# Patient Record
Sex: Female | Born: 1945 | Race: Asian | Hispanic: Yes | Marital: Married | Smoking: Never smoker
Health system: Southern US, Community
[De-identification: ages and names within clinical notes are randomized; demographics above are authoritative.]

## PROBLEM LIST (undated history)

## (undated) DIAGNOSIS — E119 Type 2 diabetes mellitus without complications: Secondary | ICD-10-CM

## (undated) DIAGNOSIS — G4700 Insomnia, unspecified: Secondary | ICD-10-CM

## (undated) DIAGNOSIS — K7460 Unspecified cirrhosis of liver: Secondary | ICD-10-CM

## (undated) DIAGNOSIS — D696 Thrombocytopenia, unspecified: Secondary | ICD-10-CM

## (undated) HISTORY — PX: CATARACT EXTRACTION: SUR2

## (undated) HISTORY — PX: RETINAL DETACHMENT SURGERY: SHX105

---

## 2013-12-09 ENCOUNTER — Telehealth: Payer: Self-pay | Admitting: Hematology & Oncology

## 2013-12-09 NOTE — Telephone Encounter (Signed)
Left vm w NEW PATIENT today to remind them of their appointment with Dr. Ennever. Also, advised them to bring all medication bottles and insurance card information. ° °

## 2013-12-10 ENCOUNTER — Ambulatory Visit (HOSPITAL_BASED_OUTPATIENT_CLINIC_OR_DEPARTMENT_OTHER): Payer: 59 | Admitting: Lab

## 2013-12-10 ENCOUNTER — Ambulatory Visit (HOSPITAL_BASED_OUTPATIENT_CLINIC_OR_DEPARTMENT_OTHER): Payer: 59 | Admitting: Family

## 2013-12-10 ENCOUNTER — Encounter: Payer: Self-pay | Admitting: Family

## 2013-12-10 ENCOUNTER — Ambulatory Visit: Payer: 59

## 2013-12-10 ENCOUNTER — Other Ambulatory Visit: Payer: Self-pay | Admitting: *Deleted

## 2013-12-10 VITALS — BP 150/87 | HR 69 | Temp 97.6°F | Resp 16 | Ht 60.0 in | Wt 180.0 lb

## 2013-12-10 DIAGNOSIS — D709 Neutropenia, unspecified: Secondary | ICD-10-CM

## 2013-12-10 DIAGNOSIS — D696 Thrombocytopenia, unspecified: Secondary | ICD-10-CM

## 2013-12-10 LAB — CBC WITH DIFFERENTIAL (CANCER CENTER ONLY)
BASO#: 0 10*3/uL (ref 0.0–0.2)
BASO%: 0.3 % (ref 0.0–2.0)
EOS%: 3.6 % (ref 0.0–7.0)
Eosinophils Absolute: 0.1 10*3/uL (ref 0.0–0.5)
HCT: 38 % (ref 34.8–46.6)
HGB: 13.1 g/dL (ref 11.6–15.9)
LYMPH#: 0.8 10*3/uL — ABNORMAL LOW (ref 0.9–3.3)
LYMPH%: 24.9 % (ref 14.0–48.0)
MCH: 30.5 pg (ref 26.0–34.0)
MCHC: 34.5 g/dL (ref 32.0–36.0)
MCV: 89 fL (ref 81–101)
MONO#: 0.2 10*3/uL (ref 0.1–0.9)
MONO%: 7.3 % (ref 0.0–13.0)
NEUT#: 2.1 10*3/uL (ref 1.5–6.5)
NEUT%: 63.9 % (ref 39.6–80.0)
Platelets: 49 10*3/uL — ABNORMAL LOW (ref 145–400)
RBC: 4.29 10*6/uL (ref 3.70–5.32)
RDW: 14.9 % (ref 11.1–15.7)
WBC: 3.3 10*3/uL — AB (ref 3.9–10.0)

## 2013-12-10 LAB — FERRITIN CHCC: Ferritin: 112 ng/ml (ref 9–269)

## 2013-12-10 LAB — CHCC SATELLITE - SMEAR

## 2013-12-10 NOTE — Progress Notes (Signed)
Hematology/Oncology Consultation   Name: Rita Kemp      MRN: [id number]    Location: Room/bed info not found  Date: 12/10/2013 Time:2:59 PM   REFERRING PHYSICIAN:  Dr. Theodis Sato  REASON FOR CONSULT:  Low WBC and low platelets   DIAGNOSIS:  Thrombocytopenia and neutropenia.   HISTORY OF PRESENT ILLNESS:  Ms. Rita Kemp is a very sweet 68 yo female with neutropenia and thrombocytopenia. She has had no symptoms and states that she feels good. Her recent lab work with her PCP showed a WBC count of 3.2 and platelet count of 64. Today they are 3.3 and 49. She has never had issues with her blood work before. She has no family history of blood disorders, hepatitis or splenomegaly. She thinks that she may have had malaria as a child. She has had diabetes for 15 years and she states that it is poorly controlled. She has always been at home. She baby sits a lot. She is originally from [place] and lived in [place] before moving to [place]. She had an appendectomy a few years ago and has had surgery on her left knee. She denies fatigue, fever, chills, n/v, cough, rash, headache, dizziness, SOB, chest pain, palpitations, constipation, diarrhea, blood in urine or stool. Her appetite is good. She eats a lot of fruit and vegetable but not much meat.   ROS: General ROS: negative Hematological and Lymphatic ROS: negative Endocrine ROS: negative Respiratory ROS: no cough, shortness of breath, or wheezing negative Cardiovascular ROS: no chest pain or dyspnea on exertion negative Gastrointestinal ROS: no abdominal pain, change in bowel habits, or black or bloody stools negative Genito-Urinary ROS: no dysuria, trouble voiding, or hematuria negative Musculoskeletal ROS: negative Neurological ROS: no TIA or stroke symptoms negative Dermatological ROS: negative   PAST MEDICAL HISTORY:   No past medical history on file.  ALLERGIES: Allergies  Allergen Reactions  . Amoxicillin    MEDICATIONS:   No current outpatient prescriptions on file prior to visit.   No current facility-administered medications on file prior to visit.    PAST SURGICAL HISTORY No past surgical history on file.  FAMILY HISTORY: No family history on file.  SOCIAL HISTORY:  reports that she has never smoked. She has never used smokeless tobacco. Her alcohol and drug histories are not on file.  PERFORMANCE STATUS: The patient's performance status is 0 - Asymptomatic  PHYSICAL EXAM: Most Recent Vital Signs: Blood pressure 150/87, pulse 69, temperature 97.6 F (36.4 C), temperature source Oral, resp. rate 16, height 5' (1.524 m), weight 180 lb (81.647 kg). BP 150/87  Pulse 69  Temp(Src) 97.6 F (36.4 C) (Oral)  Resp 16  Ht 5' (1.524 m)  Wt 180 lb (81.647 kg)  BMI 35.15 kg/m2  General Appearance:    Alert, cooperative, no distress, appears stated age  Head:    Normocephalic, without obvious abnormality, atraumatic           Throat:   Lips, mucosa, and tongue normal; teeth and gums normal  Neck:   Supple, symmetrical, trachea midline, no adenopathy;    thyroid:  no enlargement/tenderness/nodules; no carotid   bruit or JVD  Back:     Symmetric, no curvature, ROM normal, no CVA tenderness  Lungs:     Clear to auscultation bilaterally, respirations unlabored  Chest Wall:    No tenderness or deformity   Heart:    Regular rate and rhythm, S1 and S2 normal, no murmur, rub   or gallop  Abdomen:     Soft, non-tender, bowel sounds active all four quadrants,    no masses, no organomegaly        Extremities:   Extremities normal, atraumatic, no cyanosis or edema  Pulses:   2+ and symmetric all extremities  Skin:   Skin color, texture, turgor normal, no rashes or lesions  Lymph nodes:   Cervical, supraclavicular, and axillary nodes normal  Neurologic:   CNII-XII intact, normal strength, sensation and reflexes    throughout   LABORATORY DATA:  Results for orders placed in visit on 12/10/13 (from  the past 48 hour(s))  CBC WITH DIFFERENTIAL ([facility])     Status: Abnormal   Collection Time    12/10/13  1:27 PM      Result Value Ref Range   WBC 3.3 (*) 3.9 - 10.0 10e3/uL   RBC 4.29  3.70 - 5.32 10e6/uL   HGB 13.1  11.6 - 15.9 g/dL   HCT 38.0  34.8 - 46.6 %   MCV 89  81 - 101 fL   MCH 30.5  26.0 - 34.0 pg   MCHC 34.5  32.0 - 36.0 g/dL   RDW 14.9  11.1 - 15.7 %   Platelets 49 Platelet count consistent in citrate (*) 145 - 400 10e3/uL   NEUT# 2.1  1.5 - 6.5 10e3/uL   LYMPH# 0.8 (*) 0.9 - 3.3 10e3/uL   MONO# 0.2  0.1 - 0.9 10e3/uL   Eosinophils Absolute 0.1  0.0 - 0.5 10e3/uL   BASO# 0.0  0.0 - 0.2 10e3/uL   NEUT% 63.9  39.6 - 80.0 %   LYMPH% 24.9  14.0 - 48.0 %   MONO% 7.3  0.0 - 13.0 %   EOS% 3.6  0.0 - 7.0 %   BASO% 0.3  0.0 - 2.0 %  [facility] - SMEAR     Status: None   Collection Time    12/10/13  1:27 PM      Result Value Ref Range   Smear Result Smear Available        RADIOGRAPHY: No results found.     PATHOLOGY:  None  ASSESSMENT/PLAN:  Ms. Rita Kemp is a very sweet 68 yo female with neutropenia and thrombocytopenia. She is completely asymptomatic at this time.  We will get an ultrasound of her abdomen next week to assess for splenomegally and possible cirrhosis. She may have NASH.  We will see her back in 2 weeks for labs and follow-up.  She will try to keep her diabetes under better control and exercise.  All questions were answered. The patient knows to call the clinic with any problems, questions or concerns. We can certainly see the patient much sooner if necessary. The patient and plan discussed with and also seen by Dr. Marin Olp and he is in agreement with the aforementioned.   [facility] M NP   ADDENDUM:  I saw and examined the patient. I looked at her blood smear. Her blood smear was pretty unremarkable. I do not see any immature red cells or white cells. She had a decreased number of white cells. There were no immature myeloid cells. She  had no hypersegmented polys. Her platelets were quite low. She had several large platelets. The platelets were well granulated.   On her exam, she is somewhat obese. I cannot feel her liver. It was hard to feel her spleen. She had no lymphadenopathy. There were no rashes. She had no murmurs with cardiac exam. There was no oral  lesions.  She's had diabetes for 15 years. Am not sure how well controlled this is.  I have a feeling that she may have cirrhosis. She probably has NASH. She does not have any risk factors for infectious hepatitis. She's never had a transfusion. There is no risk factor for HIV.  I think that an abdominal ultrasound would be helpful. We can see what her spleen looks like and see what her liver looks like. If she has cirrhotic changes on her liver, then she may need to have further studies.  I don't think that a bone marrow biopsy is needed right now. Her blood smear just did not look all that bad. I do not think there is any obvious myelodysplasia or a double marrow infiltration process.  We will plan to get her back in a couple weeks. We will get the abdominal ultrasound next week.  I spoke to her and her son. Judson Roch was with [place]. She saw the patient initially.  She does not drink or smoke. She is originally from [place]. She has been in the [place] for about 30 years. She said that she did have some type of illness back when she was young in [place]. It may have been malaria. Her son said that it was some type of mosquito infection.  Laurey Arrow

## 2013-12-13 ENCOUNTER — Other Ambulatory Visit (HOSPITAL_BASED_OUTPATIENT_CLINIC_OR_DEPARTMENT_OTHER): Payer: 59

## 2013-12-14 ENCOUNTER — Ambulatory Visit (HOSPITAL_BASED_OUTPATIENT_CLINIC_OR_DEPARTMENT_OTHER)
Admission: RE | Admit: 2013-12-14 | Discharge: 2013-12-14 | Disposition: A | Discharge status: Home / Self Care | Payer: 59 | Source: Ambulatory Visit | Attending: Family | Admitting: Family

## 2013-12-14 DIAGNOSIS — D709 Neutropenia, unspecified: Secondary | ICD-10-CM

## 2013-12-14 LAB — COMPREHENSIVE METABOLIC PANEL
ALBUMIN: 3.8 g/dL (ref 3.5–5.2)
ALT: 46 U/L — AB (ref 0–35)
AST: 55 U/L — ABNORMAL HIGH (ref 0–37)
Alkaline Phosphatase: 114 U/L (ref 39–117)
BILIRUBIN TOTAL: 0.7 mg/dL (ref 0.2–1.2)
BUN: 10 mg/dL (ref 6–23)
CO2: 25 meq/L (ref 19–32)
Calcium: 9.5 mg/dL (ref 8.4–10.5)
Chloride: 104 mEq/L (ref 96–112)
Creatinine, Ser: 0.63 mg/dL (ref 0.50–1.10)
GLUCOSE: 208 mg/dL — AB (ref 70–99)
POTASSIUM: 4 meq/L (ref 3.5–5.3)
SODIUM: 138 meq/L (ref 135–145)
TOTAL PROTEIN: 7.1 g/dL (ref 6.0–8.3)

## 2013-12-14 LAB — HEPATITIS PANEL, ACUTE
HCV Ab: NEGATIVE
HEP B S AG: NEGATIVE
Hep A IgM: NONREACTIVE
Hep B C IgM: NONREACTIVE

## 2013-12-14 LAB — VITAMIN B12: Vitamin B-12: 614 pg/mL (ref 211–911)

## 2013-12-14 LAB — PROTEIN ELECTROPHORESIS, SERUM, WITH REFLEX
Albumin ELP: 56.1 % (ref 55.8–66.1)
Alpha-1-Globulin: 2.6 % — ABNORMAL LOW (ref 2.9–4.9)
Alpha-2-Globulin: 7.3 % (ref 7.1–11.8)
Beta 2: 4.5 % (ref 3.2–6.5)
Beta Globulin: 7 % (ref 4.7–7.2)
Gamma Globulin: 22.5 % — ABNORMAL HIGH (ref 11.1–18.8)
TOTAL PROTEIN, SERUM ELECTROPHOR: 7.1 g/dL (ref 6.0–8.3)

## 2013-12-14 LAB — HIV ANTIBODY (ROUTINE TESTING W REFLEX): HIV 1&2 Ab, 4th Generation: NONREACTIVE

## 2013-12-14 LAB — ANA: ANA: NEGATIVE

## 2013-12-20 ENCOUNTER — Ambulatory Visit (HOSPITAL_BASED_OUTPATIENT_CLINIC_OR_DEPARTMENT_OTHER): Payer: 59 | Admitting: Hematology & Oncology

## 2013-12-20 ENCOUNTER — Encounter: Payer: Self-pay | Admitting: Family

## 2013-12-20 ENCOUNTER — Other Ambulatory Visit (HOSPITAL_BASED_OUTPATIENT_CLINIC_OR_DEPARTMENT_OTHER): Payer: 59 | Admitting: Lab

## 2013-12-20 VITALS — BP 128/53 | HR 73 | Temp 98.1°F | Resp 16 | Ht 60.0 in | Wt 179.0 lb

## 2013-12-20 DIAGNOSIS — D696 Thrombocytopenia, unspecified: Secondary | ICD-10-CM

## 2013-12-20 DIAGNOSIS — D709 Neutropenia, unspecified: Secondary | ICD-10-CM

## 2013-12-20 DIAGNOSIS — K7460 Unspecified cirrhosis of liver: Secondary | ICD-10-CM

## 2013-12-20 DIAGNOSIS — K7469 Other cirrhosis of liver: Secondary | ICD-10-CM

## 2013-12-20 NOTE — Progress Notes (Signed)
Hematology and Oncology Follow Up Visit  Rita Kemp [id number] [birth year] 68 y.o. 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 We first saw her back in a week or so ago. She presented with leukopenia and thrombocytopenia. She had an enlarged spleen.  We went ahead and got an ultrasound of her abdomen.To no one's surprise, she has cirrhosis. Because of this cirrhosis there is  portal hypertension and subsequent splenomegaly.  I suspect that the cirrhosis is from her long-standing diabetes. Am not sure how well her blood sugars are controlled.  She said that she a sister who died of cirrhosis. No other family member has cirrhosis.  To be thorough, I will check to make sure that there is no hemachromatosis. She has no risk factors for hepatitis. Her hepatitis serologies and HIV were all negative. She has a negative ANA.  She has a normal bladder and B. 12 level. We saw her, her ferritin was 112. She is a negative monoclonal spike.  There is no bleeding. Her appetite is okay. She has no nausea or vomiting.  Medications: Current outpatient prescriptions:EVENING PRIMROSE OIL PO, Take by mouth every morning., Disp: , Rfl: ;  glipiZIDE (GLUCOTROL) 10 MG tablet, Take 10 mg by mouth., Disp: , Rfl: ;  metFORMIN (GLUCOPHAGE) 1000 MG tablet, 1,000 mg 2 (two) times daily with a meal. PT ONLY TAKES ONCE DAILY, Disp: , Rfl: ;  Multiple Vitamins-Minerals (MULTIVITAL PO), Take by mouth every morning., Disp: , Rfl:  zolpidem (AMBIEN) 10 MG tablet, Take 5 mg by mouth at bedtime as needed. , Disp: , Rfl:   Allergies:  Allergies  Allergen Reactions  . Amoxicillin     Past Medical History, Surgical history, Social history, and Family History were reviewed and updated.  Review of Systems: As above  Physical Exam:  height is 5' (1.524 m)  and weight is 179 lb (81.194 kg). Her oral temperature is 98.1 F (36.7 C). Her blood pressure is 128/53 and her pulse is 73. Her respiration is 16.   Well-developed and well-nourished Hispanic female. Head and neck exam shows no ocular or oral lesions. Shows no palpable cervical or supraclavicular lymph nodes. Lungs are clear bilateral. His arousal isn't or rhonchi. Cardiac exam regular rate and rhythm with a normal S1 and S2. There are no murmurs rubs or bruits. Abdomen is soft. She's good bowel sounds. There is no fluid wave. There is no palpable liver edge. Her spleen tip might be palpable with deep inspiration. Extremities shows no clubbing cyanosis or edema. Skin exam shows no ecchymoses or petechia. Neurological exam is nonfocal.  Lab Results  Component Value Date   WBC 3.3* 12/10/2013   HGB 13.1 12/10/2013   HCT 38.0 12/10/2013   MCV 89 12/10/2013   PLT 49 Platelet count consistent in citrate* 12/10/2013     Chemistry      Component Value Date/Time   NA 138 12/10/2013 1327   K 4.0 12/10/2013 1327   CL 104 12/10/2013 1327   CO2 25 12/10/2013 1327   BUN 10 12/10/2013 1327   CREATININE 0.63 12/10/2013 1327      Component Value Date/Time   CALCIUM 9.5 12/10/2013 1327   ALKPHOS 114 12/10/2013 1327   AST 55* 12/10/2013 1327   ALT 46* 12/10/2013 1327   BILITOT 0.7 12/10/2013 1327  Impression and Plan: Rita Kemp is a 68 year old Hispanic female. She has leukopenia and thrombocytopenia. She has ultrasound changes consistent with cirrhosis and portal hypertension with subsequent splenomegaly.  I told her that I thought that the cirrhosis was from the diabetes. I told her how I thought that she got the cirrhosis. We have checked her for viral hepatitis. I have not checked her for hemochromatosis so I will make sure that I do this.  I told her that her white cell count blood count will always be low. The platelet count will fluctuate. But, I don't believe that she will ever have problems  with bleeding.  I told her that there is no medicine that we have that can help this.  I told her that she probably needs to go see a liver specialist. Her doctor, Dr. Amalia Hailey, to make a referral.  I will like to see her back in 6 weeks.  I told her that she just cannot take any aspirin. If she needs to take Motrin or Aleve on occasion, she can do this, but only with food.  I spent a good half hour with she and her son. I answered their questions.   Volanda Napoleon, MD 8/10/20152:45 PM

## 2014-01-31 ENCOUNTER — Ambulatory Visit: Payer: 59 | Admitting: Hematology & Oncology

## 2014-01-31 ENCOUNTER — Other Ambulatory Visit: Payer: 59 | Admitting: Lab

## 2014-01-31 ENCOUNTER — Telehealth: Payer: Self-pay | Admitting: Hematology & Oncology

## 2014-01-31 NOTE — Telephone Encounter (Signed)
Pt called to cancel today's appt 01/31/2014.

## 2014-02-11 ENCOUNTER — Telehealth: Payer: Self-pay | Admitting: Hematology & Oncology

## 2014-02-11 ENCOUNTER — Telehealth: Payer: Self-pay | Admitting: *Deleted

## 2014-02-11 NOTE — Telephone Encounter (Signed)
Patient called wanting followup appt.  Patient cancelled last appt on 01/31/14.  Patient concerned about her platelets.  Patient platelet count at last visit was 49,000.  Patient denies bleeding or any issues.  Appt made with scheduler for next week to see NP

## 2014-02-11 NOTE — Telephone Encounter (Signed)
Pt has to have afternoons, I offered her 10-8 at 11 am. Transferred to RN to triage

## 2014-02-16 ENCOUNTER — Other Ambulatory Visit (HOSPITAL_BASED_OUTPATIENT_CLINIC_OR_DEPARTMENT_OTHER): Payer: 59 | Admitting: Lab

## 2014-02-16 ENCOUNTER — Ambulatory Visit (HOSPITAL_BASED_OUTPATIENT_CLINIC_OR_DEPARTMENT_OTHER): Payer: 59 | Admitting: Family

## 2014-02-16 VITALS — BP 119/63 | HR 62 | Temp 97.2°F | Resp 16

## 2014-02-16 DIAGNOSIS — K766 Portal hypertension: Secondary | ICD-10-CM

## 2014-02-16 DIAGNOSIS — K7460 Unspecified cirrhosis of liver: Secondary | ICD-10-CM | POA: Insufficient documentation

## 2014-02-16 DIAGNOSIS — D696 Thrombocytopenia, unspecified: Secondary | ICD-10-CM

## 2014-02-16 DIAGNOSIS — R161 Splenomegaly, not elsewhere classified: Secondary | ICD-10-CM

## 2014-02-16 DIAGNOSIS — K7469 Other cirrhosis of liver: Secondary | ICD-10-CM

## 2014-02-16 DIAGNOSIS — D709 Neutropenia, unspecified: Secondary | ICD-10-CM

## 2014-02-16 DIAGNOSIS — E119 Type 2 diabetes mellitus without complications: Secondary | ICD-10-CM

## 2014-02-16 LAB — CMP (CANCER CENTER ONLY)
ALK PHOS: 87 U/L — AB (ref 26–84)
ALT(SGPT): 45 U/L (ref 10–47)
AST: 53 U/L — AB (ref 11–38)
Albumin: 3.6 g/dL (ref 3.3–5.5)
BUN: 11 mg/dL (ref 7–22)
CHLORIDE: 101 meq/L (ref 98–108)
CO2: 26 mEq/L (ref 18–33)
Calcium: 9.8 mg/dL (ref 8.0–10.3)
Creat: 0.6 mg/dl (ref 0.6–1.2)
Glucose, Bld: 133 mg/dL — ABNORMAL HIGH (ref 73–118)
POTASSIUM: 4.5 meq/L (ref 3.3–4.7)
SODIUM: 139 meq/L (ref 128–145)
TOTAL PROTEIN: 7.5 g/dL (ref 6.4–8.1)
Total Bilirubin: 0.8 mg/dl (ref 0.20–1.60)

## 2014-02-16 LAB — CBC WITH DIFFERENTIAL (CANCER CENTER ONLY)
BASO#: 0 10*3/uL (ref 0.0–0.2)
BASO%: 0.3 % (ref 0.0–2.0)
EOS ABS: 0.2 10*3/uL (ref 0.0–0.5)
EOS%: 4.5 % (ref 0.0–7.0)
HCT: 38.3 % (ref 34.8–46.6)
HGB: 13.2 g/dL (ref 11.6–15.9)
LYMPH#: 1.1 10*3/uL (ref 0.9–3.3)
LYMPH%: 30.4 % (ref 14.0–48.0)
MCH: 31 pg (ref 26.0–34.0)
MCHC: 34.5 g/dL (ref 32.0–36.0)
MCV: 90 fL (ref 81–101)
MONO#: 0.3 10*3/uL (ref 0.1–0.9)
MONO%: 8.2 % (ref 0.0–13.0)
NEUT%: 56.6 % (ref 39.6–80.0)
NEUTROS ABS: 2 10*3/uL (ref 1.5–6.5)
Platelets: 54 10*3/uL — ABNORMAL LOW (ref 145–400)
RBC: 4.26 10*6/uL (ref 3.70–5.32)
RDW: 14.9 % (ref 11.1–15.7)
WBC: 3.5 10*3/uL — AB (ref 3.9–10.0)

## 2014-02-16 LAB — CHCC SATELLITE - SMEAR

## 2014-02-16 NOTE — Progress Notes (Signed)
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 She is doing well. Her platelets have come up a little to 54. She has had no issues with bleeding. She is trying to better control her diabetes. She has an appointment with her liver DR on Monday. She is eating a lot of green leafy vegetables and trying to get more exercise. She has lost 4 lbs. She denies fever, chills, n/v, cough, rash, headache, dizziness, SOB chest pain, palpitations, abdominal pain, constipation, diarrhea, blood in urine or stool. No swelling, tenderness, numbness or tingling in her extremities. Overall, she seems to be doing well.   CURRENT TREATMENT: Observation  REVIEW OF SYSTEMS: All other 10 point review of systems is negative.   PAST MEDICAL HISTORY: No past medical history on file.  PAST SURGICAL HISTORY: No past surgical history on file.  FAMILY HISTORY No family history on file.  GYNECOLOGIC HISTORY:  No LMP recorded.   SOCIAL HISTORY:  History   Social History  . Marital Status: Married    Spouse Name: N/A    Number of Children: N/A  . Years of Education: N/A   Occupational History  . Not on file.   Social History Main Topics  . Smoking status: Never Smoker   . Smokeless tobacco: Never Used     Comment: never used tobacco  . Alcohol Use: Not on file  . Drug Use: Not on file  . Sexual Activity: Not on file   Other Topics Concern  . Not on file   Social History Narrative  . No narrative on file   ADVANCED DIRECTIVES: <no information>  HEALTH MAINTENANCE: History  Substance Use Topics  . Smoking status: Never Smoker   . Smokeless tobacco: Never Used     Comment: never used tobacco  . Alcohol Use: Not on file   Colonoscopy: PAP: Bone density: Lipid panel:  Allergies  Allergen Reactions  . Amoxicillin     Current Outpatient Prescriptions  Medication Sig Dispense Refill  . EVENING PRIMROSE OIL PO Take by mouth every morning.      Marland Kitchen glipiZIDE (GLUCOTROL) 10 MG tablet Take 10 mg by mouth.      . metFORMIN (GLUCOPHAGE) 1000 MG tablet 1,000 mg 2 (two) times daily with a meal. PT ONLY TAKES ONCE DAILY      . Multiple Vitamins-Minerals (MULTIVITAL PO) Take by mouth every morning.      . zolpidem (AMBIEN) 10 MG tablet Take 5 mg by mouth at bedtime as needed.        No current facility-administered medications for this visit.   OBJECTIVE: Filed Vitals:   02/16/14 1520  BP: 119/63  Pulse: 62  Temp: 97.2 F (36.2 C)  Resp: 16   There is no weight on file to calculate BMI. ECOG FS:0 - Asymptomatic Ocular: Sclerae unicteric, pupils equal, round and reactive to light Ear-nose-throat: Oropharynx clear, dentition fair Lymphatic: No cervical or supraclavicular adenopathy Lungs no rales or rhonchi, good excursion bilaterally Heart regular rate and rhythm, no murmur appreciated Abd soft, nontender, positive bowel sounds MSK no focal spinal tenderness, no joint edema Neuro: non-focal, well-oriented, appropriate affect Breasts: Deferred  LAB RESULTS: CMP     Component Value Date/Time   NA  139 02/16/2014 1449   NA 138 12/10/2013 1327   K 4.5 02/16/2014 1449   K 4.0 12/10/2013 1327   CL 101 02/16/2014 1449   CL 104 12/10/2013 1327   CO2 26 02/16/2014 1449   CO2 25 12/10/2013 1327   GLUCOSE 133* 02/16/2014 1449   GLUCOSE 208* 12/10/2013 1327   BUN 11 02/16/2014 1449   BUN 10 12/10/2013 1327   CREATININE 0.6 02/16/2014 1449   CREATININE 0.63 12/10/2013 1327   CALCIUM 9.8 02/16/2014 1449   CALCIUM 9.5 12/10/2013 1327   PROT 7.5 02/16/2014 1449   PROT 7.1 12/10/2013 1327   ALBUMIN 3.8 12/10/2013 1327   AST 53* 02/16/2014 1449   AST 55* 12/10/2013  1327   ALT 45 02/16/2014 1449   ALT 46* 12/10/2013 1327   ALKPHOS 87* 02/16/2014 1449   ALKPHOS 114 12/10/2013 1327   BILITOT 0.80 02/16/2014 1449   BILITOT 0.7 12/10/2013 1327   No results found for this basename: SPEP, UPEP,  kappa and lambda light chains   Lab Results  Component Value Date   WBC 3.5* 02/16/2014   NEUTROABS 2.0 02/16/2014   HGB 13.2 02/16/2014   HCT 38.3 02/16/2014   MCV 90 02/16/2014   PLT 54* 02/16/2014   No results found for this basename: LABCA2   No components found with this basename: LABCA125   No results found for this basename: INR,  in the last 168 hours Urinalysis No results found for this basename: colorurine, appearanceur, labspec, phurine, glucoseu, hgbur, bilirubinur, ketonesur, proteinur, urobilinogen, nitrite, leukocytesur   STUDIES: No results found.  ASSESSMENT/PLAN: Rita Kemp is a 68 year old Hispanic female with  leukopenia and thrombocytopenia. She has cirrhosis and portal hypertension with subsequent splenomegaly.  She has had no issues with bleeding. Her platelets are 54 today.  Her Hgb is 13.2. We will wait and see what the rest of her labs say.   She sees a liver specialist on Monday.  We will see her back in 3 months for labs and follow-up.  She knows to call here with any questions or concerns and to go to the Ed in the event of an emergency. We can certainly see her sooner if need be.   Eliezer Bottom, NP 02/16/2014 4:33 PM

## 2014-02-17 LAB — IRON AND TIBC CHCC
%SAT: 15 % — ABNORMAL LOW (ref 21–57)
Iron: 55 ug/dL (ref 41–142)
TIBC: 361 ug/dL (ref 236–444)
UIBC: 306 ug/dL (ref 120–384)

## 2014-02-17 LAB — FERRITIN CHCC: Ferritin: 97 ng/ml (ref 9–269)

## 2014-02-20 LAB — HEMOCHROMATOSIS DNA-PCR(C282Y,H63D)

## 2014-05-23 ENCOUNTER — Ambulatory Visit (HOSPITAL_BASED_OUTPATIENT_CLINIC_OR_DEPARTMENT_OTHER): Payer: 59 | Admitting: Hematology & Oncology

## 2014-05-23 ENCOUNTER — Ambulatory Visit: Payer: 59 | Admitting: Hematology & Oncology

## 2014-05-23 ENCOUNTER — Other Ambulatory Visit: Payer: 59 | Admitting: Lab

## 2014-05-23 ENCOUNTER — Encounter: Payer: Self-pay | Admitting: Family

## 2014-05-23 ENCOUNTER — Other Ambulatory Visit (HOSPITAL_BASED_OUTPATIENT_CLINIC_OR_DEPARTMENT_OTHER): Payer: 59 | Admitting: Lab

## 2014-05-23 VITALS — BP 120/65 | HR 66 | Temp 97.9°F | Resp 14 | Ht 60.0 in | Wt 175.0 lb

## 2014-05-23 DIAGNOSIS — D709 Neutropenia, unspecified: Secondary | ICD-10-CM

## 2014-05-23 DIAGNOSIS — D72819 Decreased white blood cell count, unspecified: Secondary | ICD-10-CM

## 2014-05-23 DIAGNOSIS — K7460 Unspecified cirrhosis of liver: Secondary | ICD-10-CM

## 2014-05-23 DIAGNOSIS — R161 Splenomegaly, not elsewhere classified: Secondary | ICD-10-CM

## 2014-05-23 DIAGNOSIS — K7469 Other cirrhosis of liver: Secondary | ICD-10-CM

## 2014-05-23 DIAGNOSIS — D696 Thrombocytopenia, unspecified: Secondary | ICD-10-CM

## 2014-05-23 DIAGNOSIS — K766 Portal hypertension: Secondary | ICD-10-CM

## 2014-05-23 LAB — CMP (CANCER CENTER ONLY)
ALK PHOS: 107 U/L — AB (ref 26–84)
ALT(SGPT): 37 U/L (ref 10–47)
AST: 46 U/L — ABNORMAL HIGH (ref 11–38)
Albumin: 3.5 g/dL (ref 3.3–5.5)
BUN, Bld: 8 mg/dL (ref 7–22)
CO2: 26 mEq/L (ref 18–33)
Calcium: 9.1 mg/dL (ref 8.0–10.3)
Chloride: 103 mEq/L (ref 98–108)
Creat: 0.7 mg/dl (ref 0.6–1.2)
Glucose, Bld: 244 mg/dL — ABNORMAL HIGH (ref 73–118)
Potassium: 3.4 mEq/L (ref 3.3–4.7)
SODIUM: 136 meq/L (ref 128–145)
Total Bilirubin: 1.5 mg/dl (ref 0.20–1.60)
Total Protein: 7 g/dL (ref 6.4–8.1)

## 2014-05-23 LAB — IRON AND TIBC CHCC
%SAT: 48 % (ref 21–57)
Iron: 158 ug/dL — ABNORMAL HIGH (ref 41–142)
TIBC: 326 ug/dL (ref 236–444)
UIBC: 168 ug/dL (ref 120–384)

## 2014-05-23 LAB — CBC WITH DIFFERENTIAL (CANCER CENTER ONLY)
BASO#: 0 10*3/uL (ref 0.0–0.2)
BASO%: 0.4 % (ref 0.0–2.0)
EOS%: 4.1 % (ref 0.0–7.0)
Eosinophils Absolute: 0.1 10*3/uL (ref 0.0–0.5)
HEMATOCRIT: 36.7 % (ref 34.8–46.6)
HGB: 12.4 g/dL (ref 11.6–15.9)
LYMPH#: 0.6 10*3/uL — ABNORMAL LOW (ref 0.9–3.3)
LYMPH%: 22.7 % (ref 14.0–48.0)
MCH: 30.7 pg (ref 26.0–34.0)
MCHC: 33.8 g/dL (ref 32.0–36.0)
MCV: 91 fL (ref 81–101)
MONO#: 0.2 10*3/uL (ref 0.1–0.9)
MONO%: 7 % (ref 0.0–13.0)
NEUT%: 65.8 % (ref 39.6–80.0)
NEUTROS ABS: 1.6 10*3/uL (ref 1.5–6.5)
PLATELETS: 45 10*3/uL — AB (ref 145–400)
RBC: 4.04 10*6/uL (ref 3.70–5.32)
RDW: 15.3 % (ref 11.1–15.7)
WBC: 2.4 10*3/uL — AB (ref 3.9–10.0)

## 2014-05-23 LAB — CHCC SATELLITE - SMEAR

## 2014-05-23 LAB — FERRITIN CHCC: FERRITIN: 71 ng/mL (ref 9–269)

## 2014-05-23 NOTE — Progress Notes (Signed)
Hematology and Oncology Follow Up Visit  Demetri Kerman [id number] 12-23-1945 69 y.o. 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  Gangwer is back for followup. She had a good time down in [place]. She was down there for Christmas. She is down there for 3 weeks. She had a good time down there.  She's had no problems with nausea or vomiting. She's had no bleeding. She's had no bruising.  She still has diabetes. I'm not sure we'll can't control she has over this period  So far, all workup has been unremarkable. She has negative serologies for hepatitis. She has negative hemochromatosis assays.  She has a normal vitamin B 12 level. When we saw her, her ferritin was 112. She is a negative monoclonal spike.   Medications: Current outpatient prescriptions: EVENING PRIMROSE OIL PO, Take by mouth every morning., Disp: , Rfl: ;  glipiZIDE (GLUCOTROL) 10 MG tablet, Take 10 mg by mouth., Disp: , Rfl: ;  metFORMIN (GLUCOPHAGE) 1000 MG tablet, 1,000 mg 2 (two) times daily with a meal. PT ONLY TAKES ONCE DAILY, Disp: , Rfl: ;  Multiple Vitamins-Minerals (MULTIVITAL PO), Take by mouth every morning., Disp: , Rfl:  zolpidem (AMBIEN) 10 MG tablet, Take 5 mg by mouth at bedtime as needed. , Disp: , Rfl:   Allergies:  Allergies  Allergen Reactions  . Amoxicillin     Past Medical History, Surgical history, Social history, and Family History were reviewed and updated.  Review of Systems: As above  Physical Exam:  height is 5' (1.524 m) and weight is 175 lb (79.379 kg). Her oral temperature is 97.9 F (36.6 C). Her blood pressure is 120/65 and her pulse is 66. Her respiration is 14.   Well-developed and well-nourished Hispanic female. Head and neck exam shows no ocular or oral lesions. Shows no palpable cervical or supraclavicular lymph nodes. Lungs are clear  bilateral. His arousal isn't or rhonchi. Cardiac exam regular rate and rhythm with a normal S1 and S2. There are no murmurs rubs or bruits. Abdomen is soft. She's good bowel sounds. There is no fluid wave. There is no palpable liver edge. Her spleen tip might be palpable with deep inspiration. Extremities shows no clubbing cyanosis or edema. Skin exam shows no ecchymoses or petechia. Neurological exam is nonfocal.  Lab Results  Component Value Date   WBC 2.4* 05/23/2014   HGB 12.4 05/23/2014   HCT 36.7 05/23/2014   MCV 91 05/23/2014   PLT 45* 05/23/2014     Chemistry      Component Value Date/Time   NA 139 02/16/2014 1449   NA 138 12/10/2013 1327   K 4.5 02/16/2014 1449   K 4.0 12/10/2013 1327   CL 101 02/16/2014 1449   CL 104 12/10/2013 1327   CO2 26 02/16/2014 1449   CO2 25 12/10/2013 1327   BUN 11 02/16/2014 1449   BUN 10 12/10/2013 1327   CREATININE 0.6 02/16/2014 1449   CREATININE 0.63 12/10/2013 1327      Component Value Date/Time   CALCIUM 9.8 02/16/2014 1449   CALCIUM 9.5 12/10/2013 1327   ALKPHOS 87* 02/16/2014 1449   ALKPHOS 114 12/10/2013 1327   AST 53* 02/16/2014 1449   AST 55* 12/10/2013 1327   ALT 45 02/16/2014 1449   ALT 46* 12/10/2013 1327   BILITOT 0.80 02/16/2014 1449   BILITOT 0.7 12/10/2013  1327         Impression and Plan: Ms. Bonneau is a 69 year old Hispanic female. She has leukopenia and thrombocytopenia. She has ultrasound changes consistent with cirrhosis and portal hypertension with subsequent splenomegaly.  I told her that I thought that the cirrhosis was from the diabetes. I told her how I thought that she got the cirrhosis. We have checked her for viral hepatitis. I have checked her for hemachromatosis. This was negative.  I told her that her white cell count blood count will always be low. The platelet count will fluctuate. But, I don't believe that she will ever have problems with bleeding.  I told her that there is no medicine that we  have that can help this.  She saw a liver specialist but apparently he did not have any records on her. Because of this, he would not see her.  She is asymptomatic. For now, we can just follow along. I really don't think that we need to do a bone marrow test on her.  I spent a good half hour with she and her son. I answered their questions.  I will plan to get her back in another 3-4 months.   Volanda Napoleon, MD 1/11/201610:57 AM

## 2014-05-26 LAB — HEMOCHROMATOSIS DNA-PCR(C282Y,H63D)

## 2014-05-26 LAB — RETICULOCYTES (CHCC)
ABS Retic: 90.2 10*3/uL (ref 19.0–186.0)
RBC.: 4.1 MIL/uL (ref 3.87–5.11)
RETIC CT PCT: 2.2 % (ref 0.4–2.3)

## 2014-08-22 ENCOUNTER — Encounter: Payer: Self-pay | Admitting: Family

## 2014-08-22 ENCOUNTER — Ambulatory Visit (HOSPITAL_BASED_OUTPATIENT_CLINIC_OR_DEPARTMENT_OTHER): Payer: 59 | Admitting: Family

## 2014-08-22 ENCOUNTER — Other Ambulatory Visit (HOSPITAL_BASED_OUTPATIENT_CLINIC_OR_DEPARTMENT_OTHER): Payer: 59

## 2014-08-22 VITALS — BP 130/60 | HR 62 | Temp 97.9°F | Resp 16 | Ht 60.0 in | Wt 173.0 lb

## 2014-08-22 DIAGNOSIS — K766 Portal hypertension: Secondary | ICD-10-CM | POA: Diagnosis not present

## 2014-08-22 DIAGNOSIS — D696 Thrombocytopenia, unspecified: Secondary | ICD-10-CM | POA: Diagnosis not present

## 2014-08-22 DIAGNOSIS — K7460 Unspecified cirrhosis of liver: Secondary | ICD-10-CM | POA: Diagnosis not present

## 2014-08-22 DIAGNOSIS — D72819 Decreased white blood cell count, unspecified: Secondary | ICD-10-CM | POA: Diagnosis not present

## 2014-08-22 DIAGNOSIS — R161 Splenomegaly, not elsewhere classified: Secondary | ICD-10-CM

## 2014-08-22 DIAGNOSIS — D709 Neutropenia, unspecified: Secondary | ICD-10-CM

## 2014-08-22 DIAGNOSIS — K7469 Other cirrhosis of liver: Secondary | ICD-10-CM

## 2014-08-22 LAB — CBC WITH DIFFERENTIAL (CANCER CENTER ONLY)
BASO#: 0 10*3/uL (ref 0.0–0.2)
BASO%: 0 % (ref 0.0–2.0)
EOS%: 4.8 % (ref 0.0–7.0)
Eosinophils Absolute: 0.1 10*3/uL (ref 0.0–0.5)
HCT: 37.2 % (ref 34.8–46.6)
HGB: 12.5 g/dL (ref 11.6–15.9)
LYMPH#: 0.5 10*3/uL — AB (ref 0.9–3.3)
LYMPH%: 20.6 % (ref 14.0–48.0)
MCH: 30.3 pg (ref 26.0–34.0)
MCHC: 33.6 g/dL (ref 32.0–36.0)
MCV: 90 fL (ref 81–101)
MONO#: 0.2 10*3/uL (ref 0.1–0.9)
MONO%: 6 % (ref 0.0–13.0)
NEUT#: 1.7 10*3/uL (ref 1.5–6.5)
NEUT%: 68.6 % (ref 39.6–80.0)
RBC: 4.13 10*6/uL (ref 3.70–5.32)
RDW: 15.1 % (ref 11.1–15.7)
WBC: 2.5 10*3/uL — ABNORMAL LOW (ref 3.9–10.0)

## 2014-08-22 LAB — COMPREHENSIVE METABOLIC PANEL
ALT: 30 U/L (ref 0–35)
AST: 37 U/L (ref 0–37)
Albumin: 3.5 g/dL (ref 3.5–5.2)
Alkaline Phosphatase: 83 U/L (ref 39–117)
BUN: 9 mg/dL (ref 6–23)
CALCIUM: 8.8 mg/dL (ref 8.4–10.5)
CHLORIDE: 106 meq/L (ref 96–112)
CO2: 25 meq/L (ref 19–32)
Creatinine, Ser: 0.53 mg/dL (ref 0.50–1.10)
Glucose, Bld: 202 mg/dL — ABNORMAL HIGH (ref 70–99)
POTASSIUM: 4 meq/L (ref 3.5–5.3)
Sodium: 140 mEq/L (ref 135–145)
Total Bilirubin: 1 mg/dL (ref 0.2–1.2)
Total Protein: 6.6 g/dL (ref 6.0–8.3)

## 2014-08-22 LAB — CHCC SATELLITE - SMEAR

## 2014-08-22 LAB — RETICULOCYTES (CHCC)
ABS RETIC: 63 10*3/uL (ref 19.0–186.0)
RBC.: 4.2 MIL/uL (ref 3.87–5.11)
Retic Ct Pct: 1.5 % (ref 0.4–2.3)

## 2014-08-22 NOTE — Progress Notes (Signed)
Hematology and Oncology Follow Up Visit  Dean Wonder [id number] 18-Jan-1946 69 y.o. 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 Tworek is here today with her husband for a follow-up. She is doing fairly well she get depressed at times worrying about her platelets. She has had very little bruising. No episodes of bleeding.  She denies fatigue, fever, chills, n/v, cough, rash, headache, dizziness, SOB chest pain, palpitations, abdominal pain, constipation, diarrhea, blood in urine or stool.  No swelling, tenderness, numbness or tingling in her extremities.No new aches or pains.  She is exercising and has lost 2 more lbs. Her appetite is good and she is staying hydrated. She is eating lots of green leafy vegetables.  She is watching her blood sugars and trying to keep them controlled. She states that she was 120 this morning.  She had negative serologies for hepatitis and negative hemochromatosis assays.  Medications:    Medication List       This list is accurate as of: 08/22/14 11:42 AM.  Always use your most recent med list.               EVENING PRIMROSE OIL PO  Take by mouth every morning.     glipiZIDE 10 MG tablet  Commonly known as:  GLUCOTROL  Take 10 mg by mouth.     metFORMIN 1000 MG tablet  Commonly known as:  GLUCOPHAGE  1,000 mg 2 (two) times daily with a meal. PT ONLY TAKES ONCE DAILY     MULTIVITAL PO  Take by mouth every morning.     zolpidem 10 MG tablet  Commonly known as:  AMBIEN  Take 5 mg by mouth at bedtime as needed.        Allergies:  Allergies  Allergen Reactions  . Amoxicillin     Past Medical History, Surgical history, Social history, and Family History were reviewed and updated.  Review of Systems: All other 10 point review of systems is negative.   Physical Exam:  height is 5' (1.524 m) and weight is 173  lb (78.472 kg). Her oral temperature is 97.9 F (36.6 C). Her blood pressure is 130/60 and her pulse is 62. Her respiration is 16.   Wt Readings from Last 3 Encounters:  08/22/14 173 lb (78.472 kg)  05/23/14 175 lb (79.379 kg)  12/20/13 179 lb (81.194 kg)    Ocular: Sclerae unicteric, pupils equal, round and reactive to light Ear-nose-throat: Oropharynx clear, dentition fair Lymphatic: No cervical or supraclavicular adenopathy Lungs no rales or rhonchi, good excursion bilaterally Heart regular rate and rhythm, no murmur appreciated Abd soft, nontender, positive bowel sounds MSK no focal spinal tenderness, no joint edema Neuro: non-focal, well-oriented, appropriate affect Breasts: Deferred  Lab Results  Component Value Date   WBC 2.5* 08/22/2014   HGB 12.5 08/22/2014   HCT 37.2 08/22/2014   MCV 90 08/22/2014   PLT 45 Platelet count consistent in citrate* 08/22/2014   Lab Results  Component Value Date   FERRITIN 71 05/23/2014   IRON 158* 05/23/2014   TIBC 326 05/23/2014   UIBC 168 05/23/2014   IRONPCTSAT 48 05/23/2014   Lab Results  Component Value Date   RETICCTPCT 2.2 05/23/2014   RBC 4.13 08/22/2014   RETICCTABS 90.2 05/23/2014   No results found for: KPAFRELGTCHN, LAMBDASER, KAPLAMBRATIO No results found for: Kandis Cocking, IGMSERUM Lab Results  Component Value Date  TOTALPROTELP 7.1 12/10/2013   ALBUMINELP 56.1 12/10/2013   A1GS 2.6* 12/10/2013   A2GS 7.3 12/10/2013   BETS 7.0 12/10/2013   BETA2SER 4.5 12/10/2013   GAMS 22.5* 12/10/2013   MSPIKE NOT DET 12/10/2013   SPEI * 12/10/2013     Chemistry      Component Value Date/Time   NA 136 05/23/2014 1018   NA 138 12/10/2013 1327   K 3.4 05/23/2014 1018   K 4.0 12/10/2013 1327   CL 103 05/23/2014 1018   CL 104 12/10/2013 1327   CO2 26 05/23/2014 1018   CO2 25 12/10/2013 1327   BUN 8 05/23/2014 1018   BUN 10 12/10/2013 1327   CREATININE 0.7 05/23/2014 1018   CREATININE 0.63 12/10/2013 1327        Component Value Date/Time   CALCIUM 9.1 05/23/2014 1018   CALCIUM 9.5 12/10/2013 1327   ALKPHOS 107* 05/23/2014 1018   ALKPHOS 114 12/10/2013 1327   AST 46* 05/23/2014 1018   AST 55* 12/10/2013 1327   ALT 37 05/23/2014 1018   ALT 46* 12/10/2013 1327   BILITOT 1.50 05/23/2014 1018   BILITOT 0.7 12/10/2013 1327     Impression and Plan: Ms. Sarver is a 69 year old Hispanic female with leukopenia and thrombocytopenia. Her ultrasound in August was consistent with cirrhosis and portal hypertension with subsequent splenomegaly. Her platelet count today is 45. She has had very little bruising and no episodes of bleeding.  She is doing well and is asymptomatic at this time. We will continue to follow along with her.  We will see her back in 3 months for labs and follow-up.  She and her husband know to call here with any questions or concerns. We can certainly see her sooner if need be.   Eliezer Bottom, NP 4/11/201611:42 AM

## 2014-11-22 ENCOUNTER — Other Ambulatory Visit: Payer: 59

## 2014-11-22 ENCOUNTER — Ambulatory Visit: Payer: 59 | Admitting: Hematology & Oncology

## 2017-04-25 ENCOUNTER — Encounter: Payer: Self-pay | Admitting: Hematology & Oncology

## 2017-05-23 ENCOUNTER — Other Ambulatory Visit: Payer: Self-pay

## 2017-05-23 ENCOUNTER — Inpatient Hospital Stay: Payer: Managed Care, Other (non HMO)

## 2017-05-23 ENCOUNTER — Inpatient Hospital Stay: Payer: Managed Care, Other (non HMO) | Attending: Hematology & Oncology | Admitting: Hematology & Oncology

## 2017-05-23 ENCOUNTER — Encounter: Payer: Self-pay | Admitting: Hematology & Oncology

## 2017-05-23 VITALS — BP 142/63 | HR 80 | Temp 98.3°F | Resp 16 | Wt 170.0 lb

## 2017-05-23 DIAGNOSIS — D708 Other neutropenia: Secondary | ICD-10-CM

## 2017-05-23 DIAGNOSIS — D696 Thrombocytopenia, unspecified: Secondary | ICD-10-CM | POA: Diagnosis not present

## 2017-05-23 DIAGNOSIS — E119 Type 2 diabetes mellitus without complications: Secondary | ICD-10-CM | POA: Diagnosis not present

## 2017-05-23 DIAGNOSIS — K7581 Nonalcoholic steatohepatitis (NASH): Secondary | ICD-10-CM | POA: Diagnosis not present

## 2017-05-23 DIAGNOSIS — R161 Splenomegaly, not elsewhere classified: Secondary | ICD-10-CM

## 2017-05-23 DIAGNOSIS — K717 Toxic liver disease with fibrosis and cirrhosis of liver: Secondary | ICD-10-CM

## 2017-05-23 DIAGNOSIS — D72819 Decreased white blood cell count, unspecified: Secondary | ICD-10-CM | POA: Insufficient documentation

## 2017-05-23 LAB — CBC WITH DIFFERENTIAL (CANCER CENTER ONLY)
BASOS ABS: 0 10*3/uL (ref 0.0–0.1)
BASOS PCT: 0 %
EOS ABS: 0.1 10*3/uL (ref 0.0–0.5)
Eosinophils Relative: 3 %
HEMATOCRIT: 36.2 % (ref 34.8–46.6)
HEMOGLOBIN: 12.4 g/dL (ref 11.6–15.9)
Lymphocytes Relative: 18 %
Lymphs Abs: 0.4 10*3/uL — ABNORMAL LOW (ref 0.9–3.3)
MCH: 31.6 pg (ref 26.0–34.0)
MCHC: 34.3 g/dL (ref 32.0–36.0)
MCV: 92.3 fL (ref 81.0–101.0)
MONO ABS: 0.1 10*3/uL (ref 0.1–0.9)
Monocytes Relative: 6 %
NEUTROS ABS: 1.6 10*3/uL (ref 1.5–6.5)
NEUTROS PCT: 73 %
Platelet Count: 49 10*3/uL — ABNORMAL LOW (ref 145–400)
RBC: 3.92 MIL/uL (ref 3.70–5.32)
RDW: 16 % — ABNORMAL HIGH (ref 11.1–15.7)
WBC Count: 2.2 10*3/uL — ABNORMAL LOW (ref 3.9–10.3)

## 2017-05-23 LAB — RETICULOCYTES
RBC.: 4.02 MIL/uL (ref 3.70–5.45)
RETIC COUNT ABSOLUTE: 84.4 10*3/uL (ref 33.7–90.7)
Retic Ct Pct: 2.1 % (ref 0.7–2.1)

## 2017-05-23 LAB — LACTATE DEHYDROGENASE: LDH: 196 U/L (ref 125–245)
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
## 2017-05-27 ENCOUNTER — Ambulatory Visit (HOSPITAL_BASED_OUTPATIENT_CLINIC_OR_DEPARTMENT_OTHER): Payer: Managed Care, Other (non HMO)

## 2017-05-29 LAB — TECHNOLOGIST SMEAR REVIEW

## 2017-06-25 LAB — PROTEIN ELECTROPHORESIS, SERUM, WITH REFLEX
A/G Ratio: 0.9 (ref 0.7–1.7)
Albumin ELP: 3.2 g/dL (ref 2.9–4.4)
Alpha-1-Globulin: 0.2 g/dL (ref 0.0–0.4)
Alpha-2-Globulin: 0.5 g/dL (ref 0.4–1.0)
Beta Globulin: 1.2 g/dL (ref 0.7–1.3)
Gamma Globulin: 1.6 g/dL (ref 0.4–1.8)
Globulin, Total: 3.5 g/dL (ref 2.2–3.9)
Total Protein ELP: 6.7 g/dL (ref 6.0–8.5)

## 2017-10-18 ENCOUNTER — Ambulatory Visit (HOSPITAL_BASED_OUTPATIENT_CLINIC_OR_DEPARTMENT_OTHER)
Admission: RE | Admit: 2017-10-18 | Discharge: 2017-10-18 | Disposition: A | Discharge status: Home / Self Care | Payer: Managed Care, Other (non HMO) | Source: Ambulatory Visit | Attending: Hematology & Oncology | Admitting: Hematology & Oncology

## 2017-10-18 DIAGNOSIS — X58XXXA Exposure to other specified factors, initial encounter: Secondary | ICD-10-CM | POA: Insufficient documentation

## 2017-10-18 DIAGNOSIS — T50905A Adverse effect of unspecified drugs, medicaments and biological substances, initial encounter: Secondary | ICD-10-CM | POA: Insufficient documentation

## 2017-10-18 DIAGNOSIS — K8020 Calculus of gallbladder without cholecystitis without obstruction: Secondary | ICD-10-CM | POA: Insufficient documentation

## 2017-10-18 DIAGNOSIS — K717 Toxic liver disease with fibrosis and cirrhosis of liver: Secondary | ICD-10-CM | POA: Diagnosis present

## 2017-10-20 ENCOUNTER — Telehealth: Payer: Self-pay | Admitting: *Deleted

## 2017-10-20 NOTE — Telephone Encounter (Addendum)
-----   Message from Volanda Napoleon, MD sent at 10/20/2017  6:11 AM EDT ----- Call - the U/S looks stable. No enlatged spleen.  Liver has cirhosis.

## 2017-11-20 ENCOUNTER — Other Ambulatory Visit: Payer: Self-pay

## 2017-11-20 ENCOUNTER — Encounter: Payer: Self-pay | Admitting: Family

## 2017-11-20 ENCOUNTER — Inpatient Hospital Stay: Payer: Managed Care, Other (non HMO) | Attending: Family | Admitting: Family

## 2017-11-20 ENCOUNTER — Inpatient Hospital Stay: Payer: Managed Care, Other (non HMO)

## 2017-11-20 VITALS — BP 143/54 | HR 77 | Temp 98.3°F | Resp 18 | Wt 168.0 lb

## 2017-11-20 DIAGNOSIS — E119 Type 2 diabetes mellitus without complications: Secondary | ICD-10-CM | POA: Insufficient documentation

## 2017-11-20 DIAGNOSIS — R161 Splenomegaly, not elsewhere classified: Secondary | ICD-10-CM | POA: Diagnosis not present

## 2017-11-20 DIAGNOSIS — K717 Toxic liver disease with fibrosis and cirrhosis of liver: Secondary | ICD-10-CM | POA: Diagnosis not present

## 2017-11-20 DIAGNOSIS — D708 Other neutropenia: Secondary | ICD-10-CM

## 2017-11-20 DIAGNOSIS — D72819 Decreased white blood cell count, unspecified: Secondary | ICD-10-CM | POA: Diagnosis present

## 2017-11-20 DIAGNOSIS — D696 Thrombocytopenia, unspecified: Secondary | ICD-10-CM | POA: Diagnosis not present

## 2017-11-20 LAB — CBC WITH DIFFERENTIAL (CANCER CENTER ONLY)
BASOS ABS: 0 10*3/uL (ref 0.0–0.1)
Basophils Relative: 0 %
Eosinophils Absolute: 0.1 10*3/uL (ref 0.0–0.5)
Eosinophils Relative: 5 %
HCT: 36.8 % (ref 34.8–46.6)
HEMOGLOBIN: 12.3 g/dL (ref 11.6–15.9)
LYMPHS ABS: 0.5 10*3/uL — AB (ref 0.9–3.3)
LYMPHS PCT: 19 %
MCH: 30.8 pg (ref 26.0–34.0)
MCHC: 33.4 g/dL (ref 32.0–36.0)
MCV: 92 fL (ref 81.0–101.0)
Monocytes Absolute: 0.2 10*3/uL (ref 0.1–0.9)
Monocytes Relative: 8 %
NEUTROS PCT: 68 %
Neutro Abs: 1.6 10*3/uL (ref 1.5–6.5)
Platelet Count: 46 10*3/uL — ABNORMAL LOW (ref 145–400)
RBC: 4 MIL/uL (ref 3.70–5.32)
RDW: 15.2 % (ref 11.1–15.7)
WBC: 2.4 10*3/uL — AB (ref 3.9–10.0)

## 2017-11-20 LAB — CMP (CANCER CENTER ONLY)
ALBUMIN: 3.3 g/dL — AB (ref 3.5–5.0)
ALT: 34 U/L (ref 10–47)
AST: 48 U/L — ABNORMAL HIGH (ref 11–38)
Alkaline Phosphatase: 117 U/L — ABNORMAL HIGH (ref 26–84)
Anion gap: 7 (ref 5–15)
BILIRUBIN TOTAL: 1.5 mg/dL (ref 0.2–1.6)
BUN: 6 mg/dL — ABNORMAL LOW (ref 7–22)
CHLORIDE: 103 mmol/L (ref 98–108)
CO2: 29 mmol/L (ref 18–33)
CREATININE: 0.4 mg/dL — AB (ref 0.60–1.20)
Calcium: 9 mg/dL (ref 8.0–10.3)
Glucose, Bld: 263 mg/dL — ABNORMAL HIGH (ref 73–118)
POTASSIUM: 3.9 mmol/L (ref 3.3–4.7)
Sodium: 139 mmol/L (ref 128–145)
Total Protein: 6.6 g/dL (ref 6.4–8.1)

## 2017-11-20 LAB — SAMPLE TO BLOOD BANK

## 2017-11-20 LAB — PLATELET BY CITRATE

## 2017-11-20 NOTE — Progress Notes (Signed)
Hematology and Oncology Follow Up Visit  Rita Kemp [id number] [birth year] 72 y.o. 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 Rita Kemp is here today for follow-up. She is doing quite well and has no complaints at this time. Platelet count is 46 and WBC count 2.4.  She has had no episodes of bleeding, no bruising or petechiae.  Abdominal US in June was stable. No splenomegaly.  No issues with infection. No fever, chills, n/v, cough, rash, dizziness, SOB, chest pain, palpitations, abdominal pain or changes in bowel or bladder habits.  No swelling, tenderness, numbness or tingling in her extremities.  No lymphadenopathy noted on exam.  She has a good appetite and is staying well hydrated. Her weight is stable.   ECOG Performance Status: 0 - Asymptomatic  Medications:  Allergies as of 11/20/2017      Reactions   Amoxicillin       Medication List        Accurate as of 11/20/17 11:25 AM. Always use your most recent med list.          EVENING PRIMROSE OIL PO Take by mouth every morning.   glipiZIDE 10 MG tablet Commonly known as:  GLUCOTROL Take 10 mg by mouth.   metFORMIN 1000 MG tablet Commonly known as:  GLUCOPHAGE 1,000 mg 2 (two) times daily with a meal. PT ONLY TAKES ONCE DAILY   MULTIVITAL PO Take by mouth every morning.   zolpidem 10 MG tablet Commonly known as:  AMBIEN Take 5 mg by mouth at bedtime as needed.       Allergies:  Allergies  Allergen Reactions  . Amoxicillin     Past Medical History, Surgical history, Social history, and Family History were reviewed and updated.  Review of Systems: All other 10 point review of systems is negative.   Physical Exam:  vitals were not taken for this visit.   Wt Readings from Last 3 Encounters:  05/23/17 170 lb (77.1 kg)  08/22/14 173 lb (78.5 kg)  05/23/14 175 lb  (79.4 kg)    Ocular: Sclerae unicteric, pupils equal, round and reactive to light Ear-nose-throat: Oropharynx clear, dentition fair Lymphatic: No cervical, supraclavicular or axillary adenopathy Lungs no rales or rhonchi, good excursion bilaterally Heart regular rate and rhythm, no murmur appreciated Abd soft, nontender, positive bowel sounds, no liver or spleen tip palpated on exam, no fluid wave  MSK no focal spinal tenderness, no joint edema Neuro: non-focal, well-oriented, appropriate affect Breasts: Deferred   Lab Results  Component Value Date   WBC 2.4 (L) 11/20/2017   HGB 12.3 11/20/2017   HCT 36.8 11/20/2017   MCV 92.0 11/20/2017   PLT 46 (L) 11/20/2017   Lab Results  Component Value Date   FERRITIN 71 05/23/2014   IRON 158 (H) 05/23/2014   TIBC 326 05/23/2014   UIBC 168 05/23/2014   IRONPCTSAT 48 05/23/2014   Lab Results  Component Value Date   RETICCTPCT 2.1 05/23/2017   RBC 4.00 11/20/2017   RETICCTABS 63.0 08/22/2014   No results found for: KPAFRELGTCHN, LAMBDASER, KAPLAMBRATIO No results found for: Kandis Cocking, IGMSERUM Lab Results  Component Value Date   TOTALPROTELP 6.7 05/23/2017   ALBUMINELP 3.2 05/23/2017   A1GS 0.2 05/23/2017   A2GS 0.5 05/23/2017   BETS 1.2 05/23/2017   BETA2SER 4.5 12/10/2013   GAMS 1.6 05/23/2017   MSPIKE Not Observed 05/23/2017  SPEI * 12/10/2013     Chemistry      Component Value Date/Time   NA 140 08/22/2014 0957   NA 136 05/23/2014 1018   K 4.0 08/22/2014 0957   K 3.4 05/23/2014 1018   CL 106 08/22/2014 0957   CL 103 05/23/2014 1018   CO2 25 08/22/2014 0957   CO2 26 05/23/2014 1018   BUN 9 08/22/2014 0957   BUN 8 05/23/2014 1018   CREATININE 0.53 08/22/2014 0957   CREATININE 0.7 05/23/2014 1018      Component Value Date/Time   CALCIUM 8.8 08/22/2014 0957   CALCIUM 9.1 05/23/2014 1018   ALKPHOS 83 08/22/2014 0957   ALKPHOS 107 (H) 05/23/2014 1018   AST 37 08/22/2014 0957   AST 46 (H) 05/23/2014 1018    ALT 30 08/22/2014 0957   ALT 37 05/23/2014 1018   BILITOT 1.0 08/22/2014 0957   BILITOT 1.50 05/23/2014 1018      Impression and Plan: Ms. Rita Kemp is a very pleasant 72 yo Hispanic female with chronic leukopenia and thrombocytopenia secondary to NASH. PLatelet count is 46 and WBC count 2.4. No anemia. She is asymptomatic at this time.  US of the abdomen showed stable cirrhosis and no splenomegaly.  We will plan to see her back in another 6 months for follow-up.  She will contact our office with any questions or concerns. We can certainly see her sooner.   Laverna Peace, NP 7/11/201911:25 AM

## 2018-03-04 ENCOUNTER — Emergency Department (HOSPITAL_COMMUNITY): Payer: Managed Care, Other (non HMO)

## 2018-03-04 ENCOUNTER — Emergency Department (HOSPITAL_BASED_OUTPATIENT_CLINIC_OR_DEPARTMENT_OTHER): Payer: Managed Care, Other (non HMO)

## 2018-03-04 ENCOUNTER — Other Ambulatory Visit: Payer: Self-pay

## 2018-03-04 ENCOUNTER — Encounter (HOSPITAL_BASED_OUTPATIENT_CLINIC_OR_DEPARTMENT_OTHER): Payer: Self-pay | Admitting: *Deleted

## 2018-03-04 ENCOUNTER — Emergency Department (HOSPITAL_BASED_OUTPATIENT_CLINIC_OR_DEPARTMENT_OTHER)
Admission: EM | Admit: 2018-03-04 | Discharge: 2018-03-04 | Disposition: A | Discharge status: Home / Self Care | Payer: Managed Care, Other (non HMO) | Attending: Emergency Medicine | Admitting: Emergency Medicine

## 2018-03-04 DIAGNOSIS — Z79899 Other long term (current) drug therapy: Secondary | ICD-10-CM | POA: Diagnosis not present

## 2018-03-04 DIAGNOSIS — R1084 Generalized abdominal pain: Secondary | ICD-10-CM

## 2018-03-04 DIAGNOSIS — R188 Other ascites: Secondary | ICD-10-CM | POA: Insufficient documentation

## 2018-03-04 DIAGNOSIS — E119 Type 2 diabetes mellitus without complications: Secondary | ICD-10-CM | POA: Diagnosis not present

## 2018-03-04 DIAGNOSIS — R1011 Right upper quadrant pain: Secondary | ICD-10-CM | POA: Diagnosis present

## 2018-03-04 DIAGNOSIS — K7460 Unspecified cirrhosis of liver: Secondary | ICD-10-CM | POA: Insufficient documentation

## 2018-03-04 DIAGNOSIS — Z7984 Long term (current) use of oral hypoglycemic drugs: Secondary | ICD-10-CM | POA: Diagnosis not present

## 2018-03-04 HISTORY — DX: Type 2 diabetes mellitus without complications: E11.9

## 2018-03-04 HISTORY — DX: Unspecified cirrhosis of liver: K74.60

## 2018-03-04 HISTORY — DX: Insomnia, unspecified: G47.00

## 2018-03-04 HISTORY — DX: Thrombocytopenia, unspecified: D69.6

## 2018-03-04 LAB — COMPREHENSIVE METABOLIC PANEL
ALT: 24 U/L (ref 0–44)
AST: 44 U/L — AB (ref 15–41)
Albumin: 3.5 g/dL (ref 3.5–5.0)
Alkaline Phosphatase: 88 U/L (ref 38–126)
Anion gap: 12 (ref 5–15)
BILIRUBIN TOTAL: 1.8 mg/dL — AB (ref 0.3–1.2)
BUN: 8 mg/dL (ref 8–23)
CO2: 21 mmol/L — ABNORMAL LOW (ref 22–32)
CREATININE: 0.56 mg/dL (ref 0.44–1.00)
Calcium: 9.4 mg/dL (ref 8.9–10.3)
Chloride: 107 mmol/L (ref 98–111)
GFR calc Af Amer: >60 mL/min (ref >60)
Glucose, Bld: 169 mg/dL — ABNORMAL HIGH (ref 70–99)
Potassium: 3.6 mmol/L (ref 3.5–5.1)
Sodium: 140 mmol/L (ref 135–145)
Total Protein: 7.4 g/dL (ref 6.5–8.1)

## 2018-03-04 LAB — CBC WITH DIFFERENTIAL/PLATELET
Abs Immature Granulocytes: 0.02 10*3/uL (ref 0.00–0.07)
Basophils Absolute: 0 10*3/uL (ref 0.0–0.1)
Basophils Relative: 1 %
EOS ABS: 0.1 10*3/uL (ref 0.0–0.5)
Eosinophils Relative: 3 %
HEMATOCRIT: 40.1 % (ref 36.0–46.0)
Hemoglobin: 13.1 g/dL (ref 12.0–15.0)
IMMATURE GRANULOCYTES: 1 %
LYMPHS ABS: 0.5 10*3/uL — AB (ref 0.7–4.0)
Lymphocytes Relative: 16 %
MCH: 30.3 pg (ref 26.0–34.0)
MCHC: 32.7 g/dL (ref 30.0–36.0)
MCV: 92.6 fL (ref 80.0–100.0)
MONO ABS: 0.2 10*3/uL (ref 0.1–1.0)
MONOS PCT: 7 %
NRBC: 0 % (ref 0.0–0.2)
Neutro Abs: 2.2 10*3/uL (ref 1.7–7.7)
Neutrophils Relative %: 72 %
Platelets: 56 10*3/uL — ABNORMAL LOW (ref 150–400)
RBC: 4.33 MIL/uL (ref 3.87–5.11)
RDW: 15.4 % (ref 11.5–15.5)
WBC: 3 10*3/uL — AB (ref 4.0–10.5)

## 2018-03-04 LAB — URINALYSIS, ROUTINE W REFLEX MICROSCOPIC
Bilirubin Urine: NEGATIVE
Glucose, UA: NEGATIVE mg/dL
Hgb urine dipstick: NEGATIVE
Ketones, ur: 15 mg/dL — AB
Leukocytes, UA: NEGATIVE
Nitrite: NEGATIVE
PROTEIN: NEGATIVE mg/dL
Specific Gravity, Urine: 1.015 (ref 1.005–1.030)
pH: 6 (ref 5.0–8.0)

## 2018-03-04 LAB — PROTIME-INR
INR: 1.24
PROTHROMBIN TIME: 15.5 s — AB (ref 11.4–15.2)

## 2018-03-04 MED ORDER — FUROSEMIDE 20 MG PO TABS: 20.0000 mg | ORAL_TABLET | Freq: Every day | ORAL | 0 refills | Status: AC

## 2018-03-04 NOTE — ED Provider Notes (Signed)
[facility] EMERGENCY DEPARTMENT Provider Note   CSN: [id number] Arrival date & time: 03/04/18  1422     History   Chief Complaint Chief Complaint  Patient presents with  . Leg Swelling    HPI Rita Kemp is a 72 y.o. female.  HPI Patient sent in by PCP for abdominal pain and right-sided chest pain.  Has a history of nonalcoholic steatohepatitis.  Since she got her flu shot a couple weeks ago states she has had right side pain and abdominal pain.  Has put on around 10 pounds.  No fevers.  Occasional cough.  States her abdomen is larger than normal.  No nausea or vomiting.  Reportedly has some worsening jaundice.  History of low white count and thrombocytopenia due to the liver. Past Medical History:  Diagnosis Date  . Cirrhosis of liver not due to alcohol ([place])   . Diabetes mellitus without complication ([place])   . Insomnia   . Thrombocytopenia [facility])     Patient Active Problem List   Diagnosis Date Noted  . Cirrhosis ([place]) 02/16/2014  . Neutropenia ([place]) 12/10/2013    Past Surgical History:  Procedure Laterality Date  . CATARACT EXTRACTION    . RETINAL DETACHMENT SURGERY       OB History   None      Home Medications    Prior to Admission medications   Medication Sig Start Date End Date Taking? Authorizing Provider  EVENING PRIMROSE OIL PO Take by mouth every morning.    [provider]  furosemide (LASIX) 20 MG tablet Take 1 tablet (20 mg total) by mouth daily. 03/04/18   Davonna Belling, MD  glipiZIDE (GLUCOTROL) 10 MG tablet Take 10 mg by mouth. 09/29/13 09/29/14  [provider]  metFORMIN (GLUCOPHAGE) 1000 MG tablet 1,000 mg 2 (two) times daily with a meal. PT ONLY TAKES ONCE DAILY 10/14/13   [provider]  Multiple Vitamins-Minerals (MULTIVITAL PO) Take by mouth every morning.    [provider]  zolpidem (AMBIEN) 10 MG tablet Take 5 mg by mouth at bedtime as needed.  10/25/13   [provider]     Family History History reviewed. No pertinent family history.  Social History Social History   Tobacco Use  . Smoking status: Never Smoker  . Smokeless tobacco: Never Used  . Tobacco comment: never used tobacco  Substance Use Topics  . Alcohol use: Not on file  . Drug use: Not on file     Allergies   Aspirin and Amoxicillin   Review of Systems Review of Systems  Constitutional: Positive for unexpected weight change. Negative for appetite change.  HENT: Negative for congestion.   Respiratory: Positive for shortness of breath.   Cardiovascular: Positive for chest pain.  Gastrointestinal: Positive for abdominal pain.  Genitourinary: Negative for flank pain.  Skin: Negative for rash.  Neurological: Negative for weakness.  Hematological: Negative for adenopathy.  Psychiatric/Behavioral: Negative for confusion.     Physical Exam Updated Vital Signs BP (!) 145/61 (BP Location: Left Arm)   Pulse 94   Temp 98 F (36.7 C) (Oral)   Resp (!) 22   Ht 5' 3"  (1.6 m)   Wt 81.2 kg   SpO2 95%   BMI 31.71 kg/m   Physical Exam  Constitutional: She appears well-developed.  HENT:  Head: Atraumatic.  Eyes: Pupils are equal, round, and reactive to light. Scleral icterus is present.  Neck: Neck supple.  Cardiovascular:  Mild tachycardia  Pulmonary/Chest:  Harsh breath  sounds bilateral bases but worse on right.  Abdominal: There is tenderness.  Right upper quadrant tenderness with mild distention.  No hernia palpated.  Musculoskeletal: She exhibits edema.  Some pitting edema bilateral lower extremities.  Skin: Capillary refill takes less than 2 seconds.     ED Treatments / Results  Labs (all labs ordered are listed, but only abnormal results are displayed) Labs Reviewed  URINALYSIS, ROUTINE W REFLEX MICROSCOPIC - Abnormal; Notable for the following components:      Result Value   Ketones, ur 15 (*)    All other components within normal limits  PROTIME-INR -  Abnormal; Notable for the following components:   Prothrombin Time 15.5 (*)    All other components within normal limits  CBC WITH DIFFERENTIAL/PLATELET - Abnormal; Notable for the following components:   WBC 3.0 (*)    Platelets 56 (*)    Lymphs Abs 0.5 (*)    All other components within normal limits  COMPREHENSIVE METABOLIC PANEL - Abnormal; Notable for the following components:   CO2 21 (*)    Glucose, Bld 169 (*)    AST 44 (*)    Total Bilirubin 1.8 (*)    All other components within normal limits    EKG EKG Interpretation  Date/Time:  Wednesday March 04 2018 15:00:42 EDT Ventricular Rate:  104 PR Interval:    QRS Duration: 84 QT Interval:  375 QTC Calculation: 494 R Axis:   9 Text Interpretation:  Sinus tachycardia Inferior infarct, old Probable anteroseptal infarct, old Baseline wander in lead(s) V3 Confirmed by Davonna Belling [phone]) on 03/04/2018 3:30:08 PM   Radiology Dg Chest 2 View  Result Date: 03/04/2018 CLINICAL DATA:  Shortness of breath with chest pain. Weight gain. Leg swelling. EXAM: CHEST - 2 VIEW COMPARISON:  None. FINDINGS: Lungs are adequately inflated without focal airspace consolidation or effusion. Cardiomediastinal silhouette is within normal. Mild degenerative change of the spine. IMPRESSION: No active cardiopulmonary disease. Electronically Signed   By: Marin Olp M.D.   On: 03/04/2018 16:15   US Abdomen Limited  Result Date: 03/04/2018 CLINICAL DATA:  Abdominal swelling. EXAM: ULTRASOUND ABDOMEN LIMITED RIGHT UPPER QUADRANT COMPARISON:  10/18/2017 FINDINGS: Gallbladder: Mild to moderate cholelithiasis with the largest stone measuring 8.4 mm. There is gallbladder wall thickening measuring 8.4 mm. Negative sonographic Murphy sign. Common bile duct: Diameter: 3 mm. Liver: Nodular contour to the liver. Mild increased parenchymal echogenicity without focal mass. Portal vein is patent on color Doppler imaging with normal direction of blood flow  towards the liver. Mild to moderate ascites.  Mild splenomegaly. IMPRESSION: Mild to moderate cholelithiasis. Nonspecific gallbladder wall thickening likely related to the mild to moderate ascites. Findings suggesting cirrhosis with mild to moderate ascites and mild splenomegaly. Electronically Signed   By: Marin Olp M.D.   On: 03/04/2018 19:10    Procedures Procedures (including critical care time)  Medications Ordered in ED Medications - No data to display   Initial Impression / Assessment and Plan / ED Course  I have reviewed the triage vital signs and the nursing notes.  Pertinent labs & imaging results that were available during my care of the patient were reviewed by me and considered in my medical decision making (see chart for details).     Patient with cirrhosis now with worsening ascites.  Had some chest pain.  Some mild shortness of breath.  X-ray reassuring.  Not hypoxic.  Ultrasound is overall stable but does have some ascites.  I think patient does  not have criteria for acute admission to the hospital right now although she will likely need a paracentesis and further evaluation of her liver.  Will discharge home to follow-up with PCP or gastroenterology.  Short course of Lasix started to get off some fluid.  Final Clinical Impressions(s) / ED Diagnoses   Final diagnoses:  Cirrhosis of liver with ascites, unspecified hepatic cirrhosis type [facility])    ED Discharge Orders         Ordered    furosemide (LASIX) 20 MG tablet  Daily     03/04/18 2035           Davonna Belling, MD 03/04/18 2037

## 2018-03-04 NOTE — ED Triage Notes (Signed)
Pt sent here from pMD today for 15 lb weight gain and selling of legs x 2 weeks

## 2018-03-04 NOTE — ED Notes (Signed)
Pt c/o R side chest pain, SOB with exertion and leg swelling. Pt has distention noted to her stomach, states this is new.

## 2018-03-04 NOTE — ED Notes (Signed)
Patient transported to X-ray 

## 2018-03-04 NOTE — ED Notes (Signed)
Pt verbalizes understanding of d/c instructions and denies any further needs at this time. 

## 2018-03-04 NOTE — ED Notes (Signed)
Patient transported to Ultrasound 

## 2018-03-04 NOTE — Discharge Instructions (Signed)
Schedule appointment with either [facility] gastroenterology or the gastroenterologist you have seen before for further follow-up of your ascites and cirrhosis.

## 2018-05-22 ENCOUNTER — Inpatient Hospital Stay: Payer: Managed Care, Other (non HMO)

## 2018-05-22 ENCOUNTER — Inpatient Hospital Stay: Payer: Managed Care, Other (non HMO) | Attending: Family | Admitting: Family

## 2018-08-17 IMAGING — US US ABDOMEN COMPLETE
1 series · 14 of 25 positions shown · non-contrast
Comparison: June 06, 2016

CLINICAL DATA: Follow-up cirrhosis and splenomegaly.

EXAM:
ABDOMEN ULTRASOUND COMPLETE

[Series 1: us abdomen complete · 0.18mm/px · 14 of 82 slices shown]
[im 1/82]
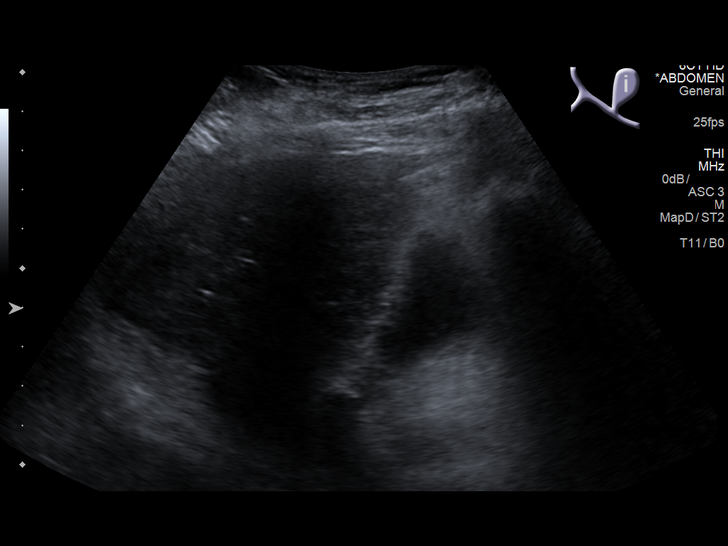
[im 7/82]
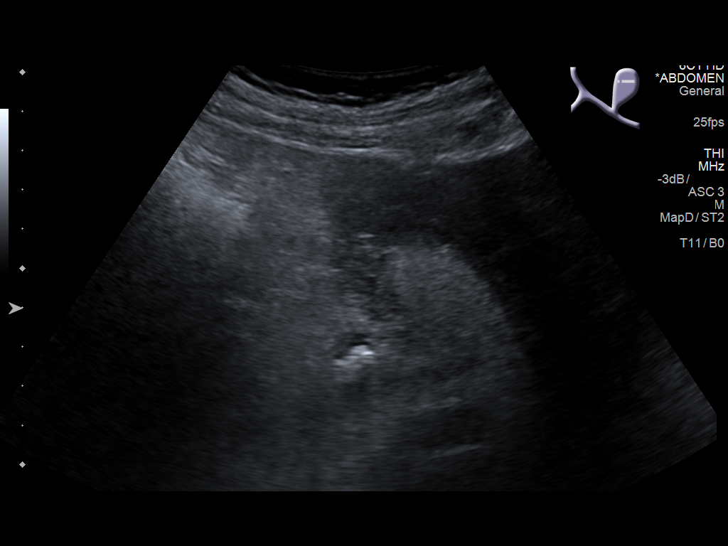
[im 14/82]
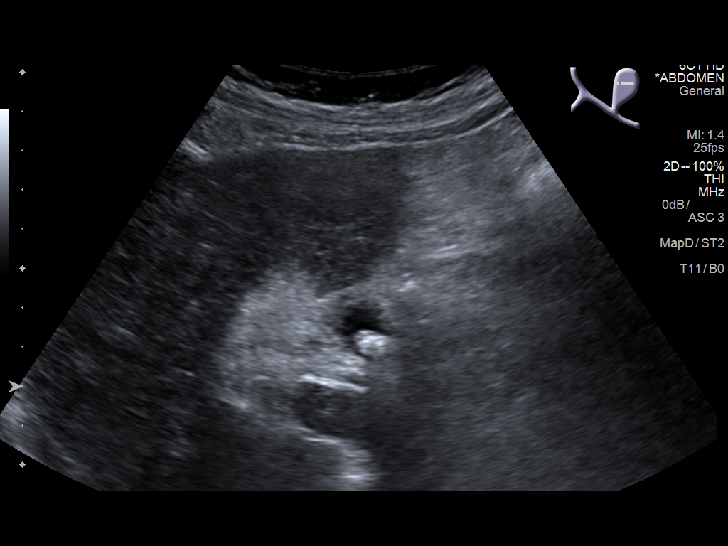
[im 21/82]
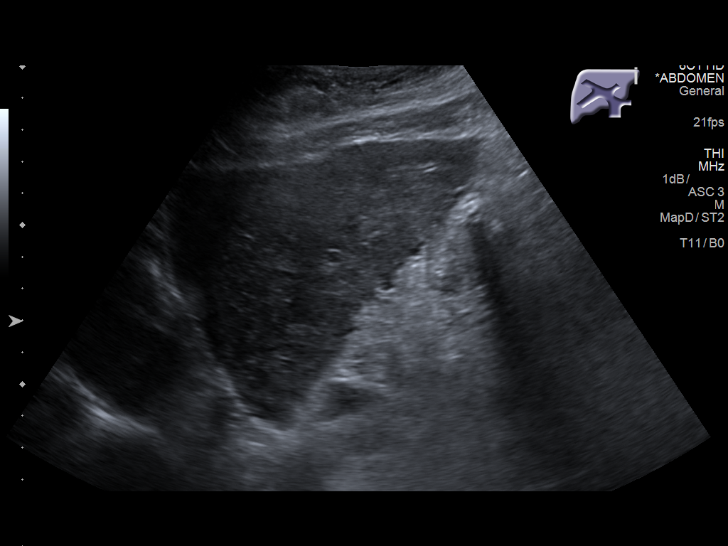
[im 28/82]
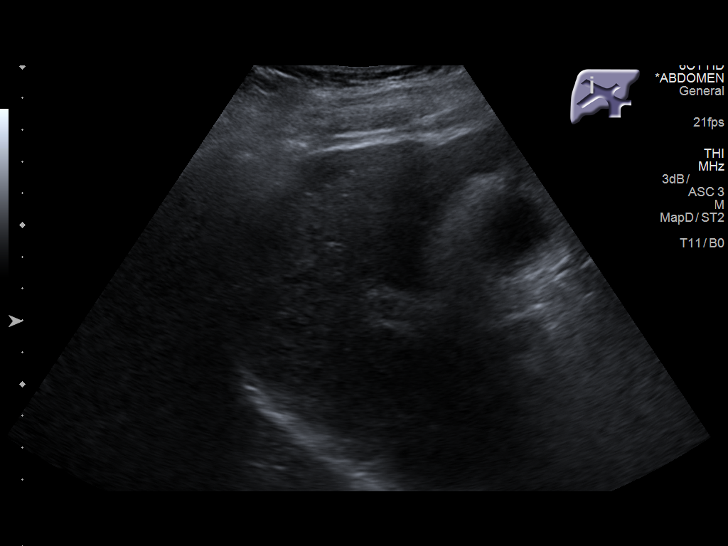
[im 31/82]
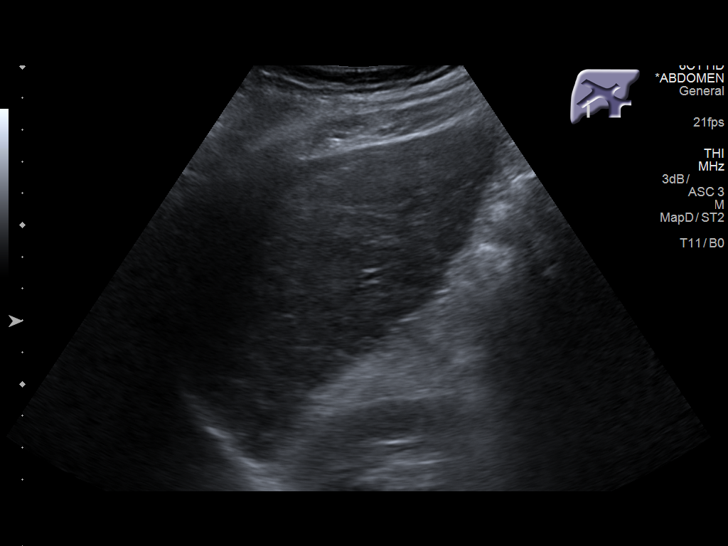
[im 38/82]
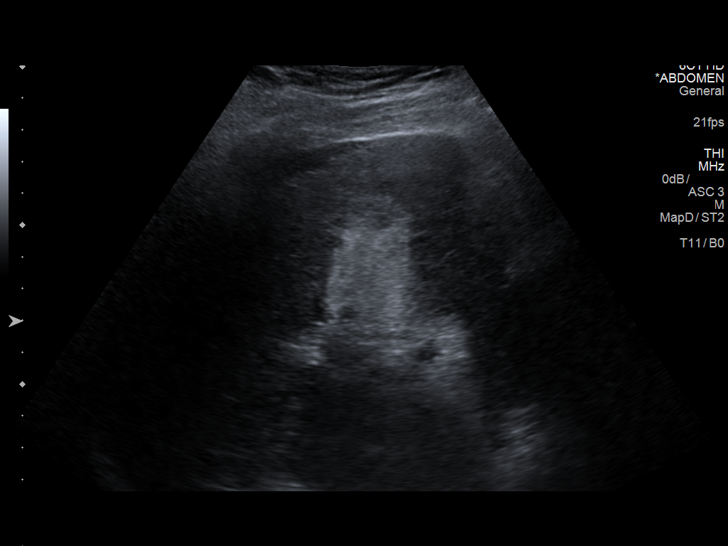
[im 44/82]
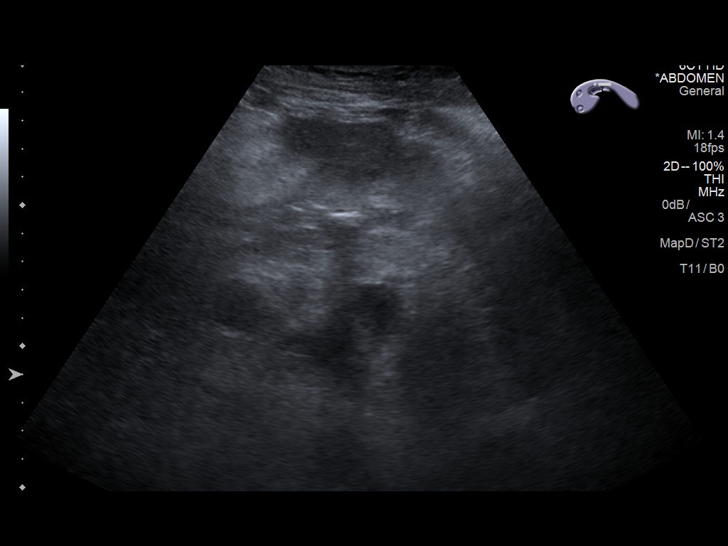
[im 51/82]
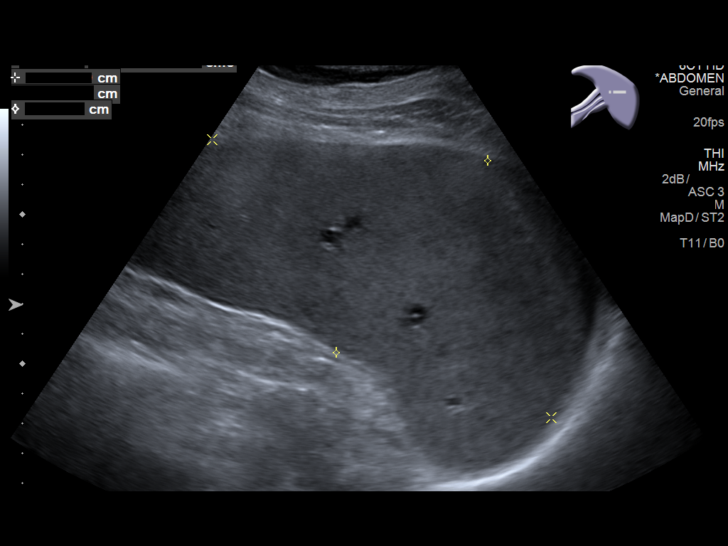
[im 55/82]
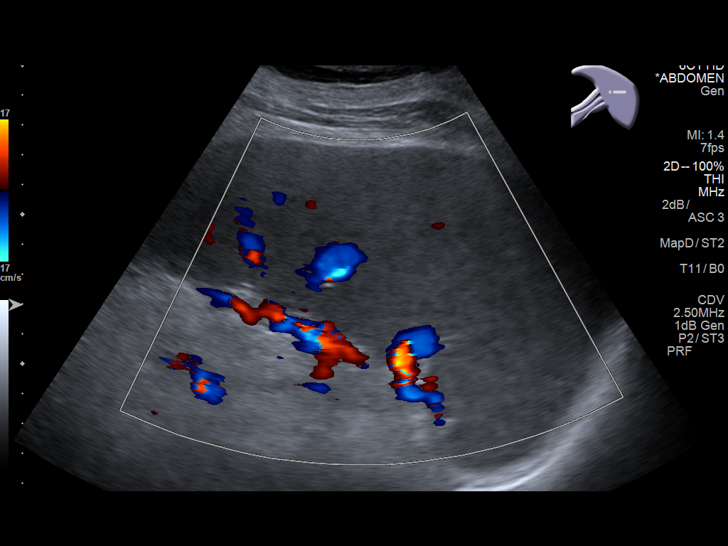
[im 61/82]
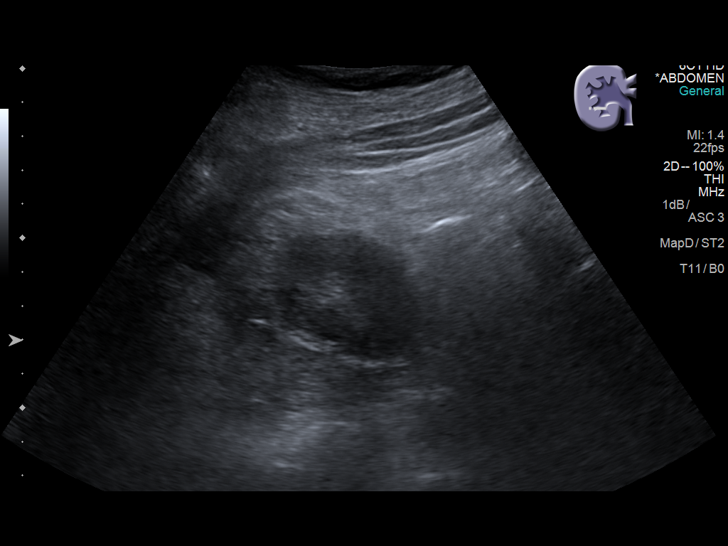
[im 68/82]
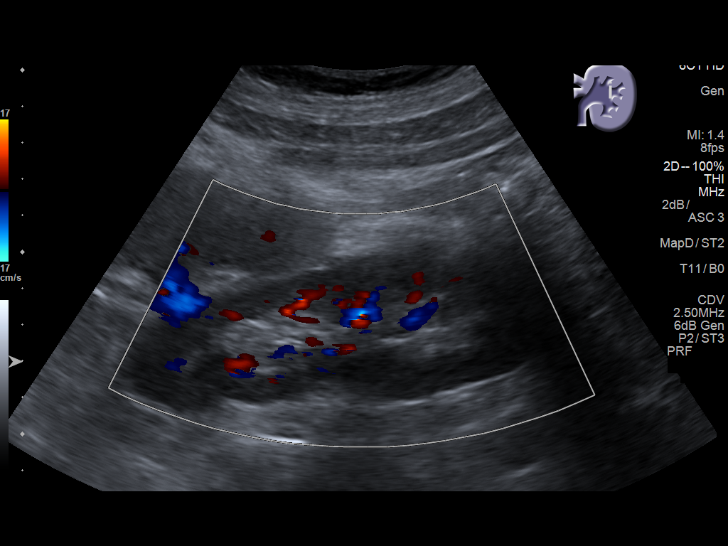
[im 75/82]
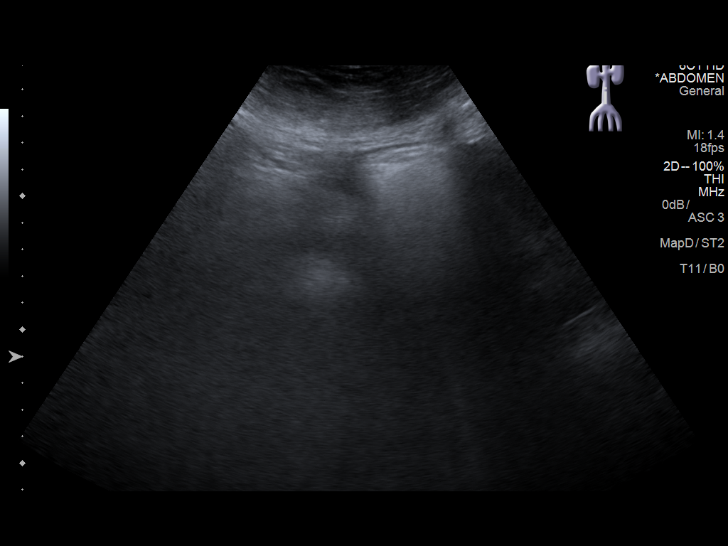
[im 82/82]
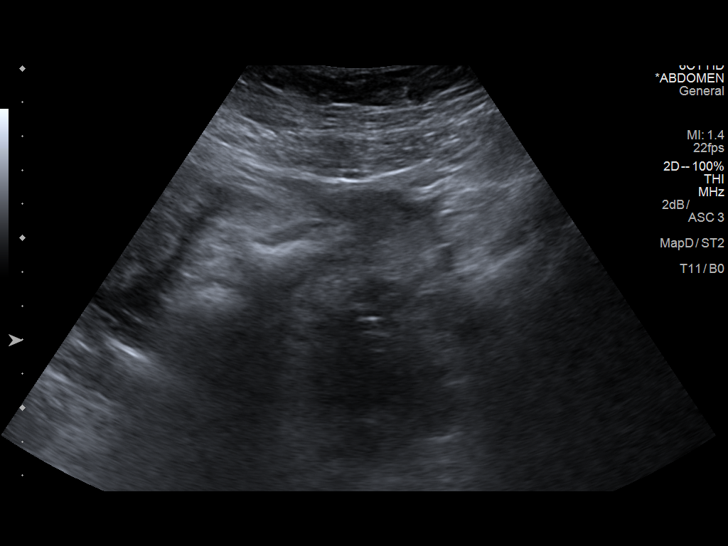

[14 of 25 positions shown; findings below may reference images not displayed]

FINDINGS: Gallbladder: Gallstones are noted. No wall thickening visualized. No
sonographic Murphy sign noted by sonographer.

Common bile duct: Diameter: 2 mm

Liver: Nodular contour of the liver is identified. 3.7 cm focal no
focal lesion identified. Within normal limits in parenchymal
echogenicity. Portal vein is patent on color Doppler imaging with
normal direction of blood flow towards the liver.

IVC: No abnormality visualized.

Pancreas: Visualized portion unremarkable.

Spleen: Size and appearance within normal limits.

Right Kidney: Length: . Echogenicity within normal limits. No mass
or hydronephrosis visualized.

Left Kidney: Length: . Echogenicity within normal limits. No mass or
hydronephrosis visualized.

Abdominal aorta: No aneurysm visualized.

Other findings: None.

## 2019-01-01 IMAGING — US US ABDOMEN LIMITED
1 series · 14 of 25 positions shown · non-contrast
Comparison: 10/18/2017

CLINICAL DATA: Abdominal swelling.

EXAM:
ULTRASOUND ABDOMEN LIMITED RIGHT UPPER QUADRANT

[Series 1: us abdomen limited · 0.19mm/px · 14 of 50 slices shown]
[im 1/50]
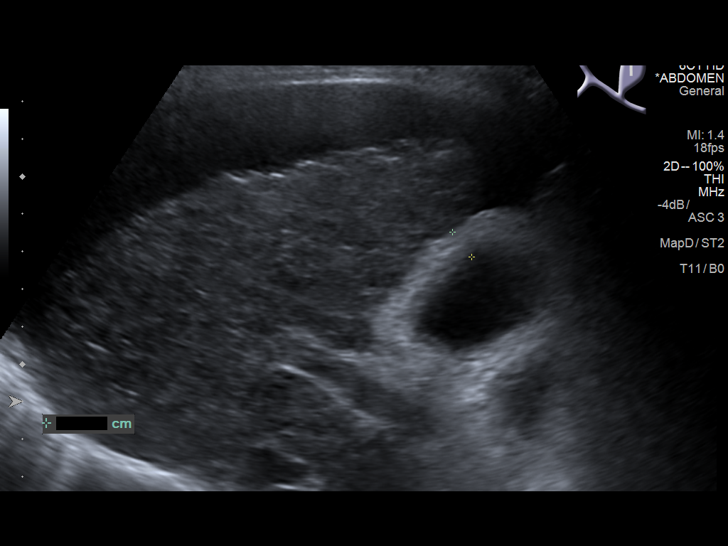
[im 5/50]
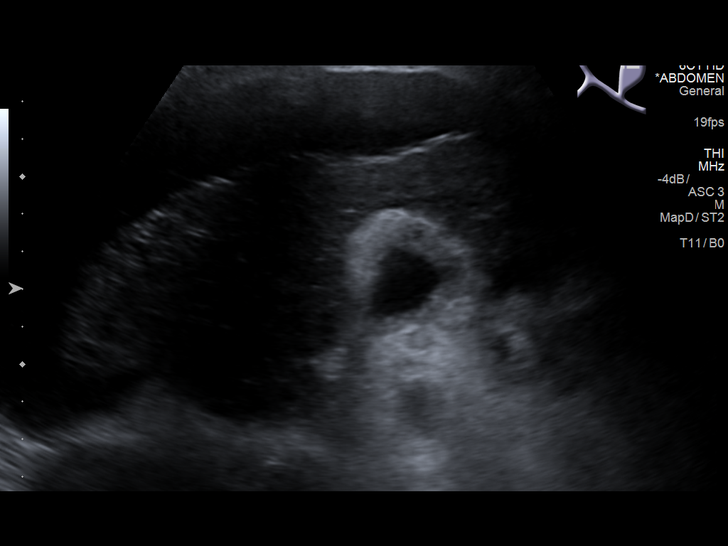
[im 9/50]
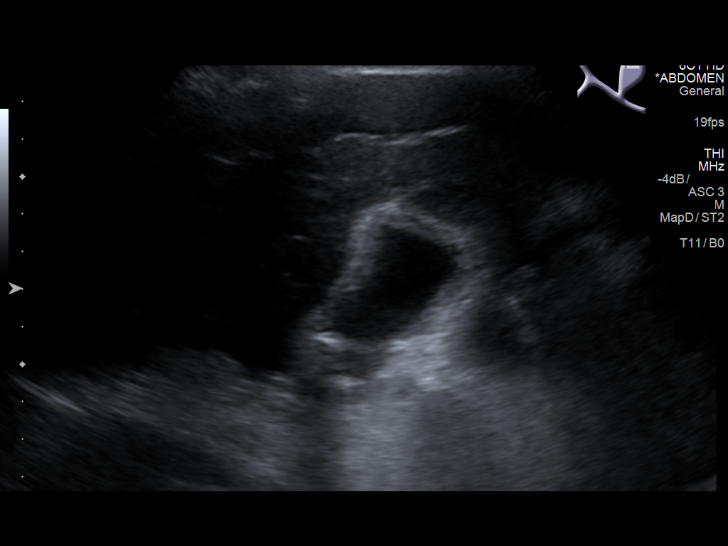
[im 13/50]
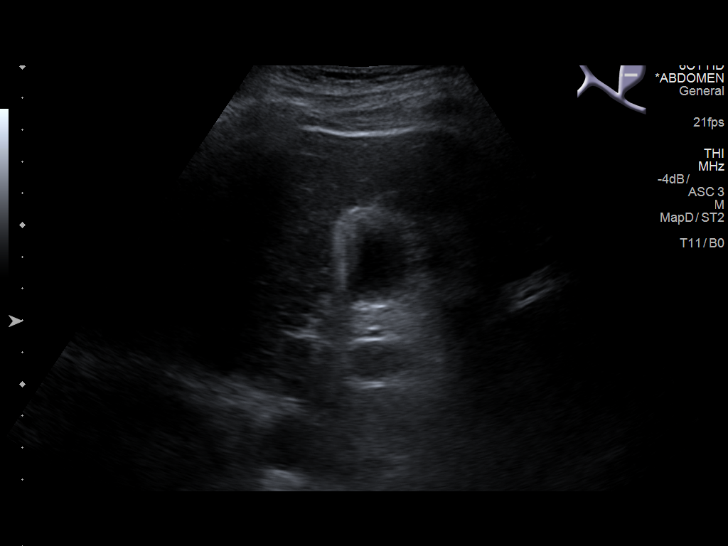
[im 17/50]
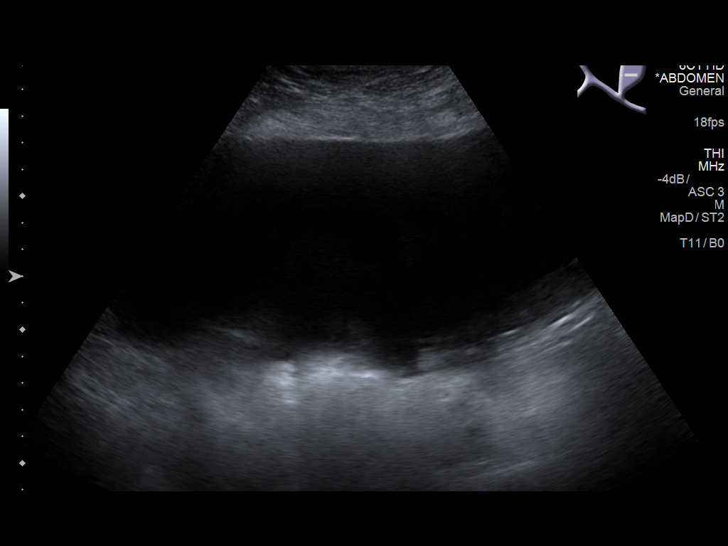
[im 19/50]
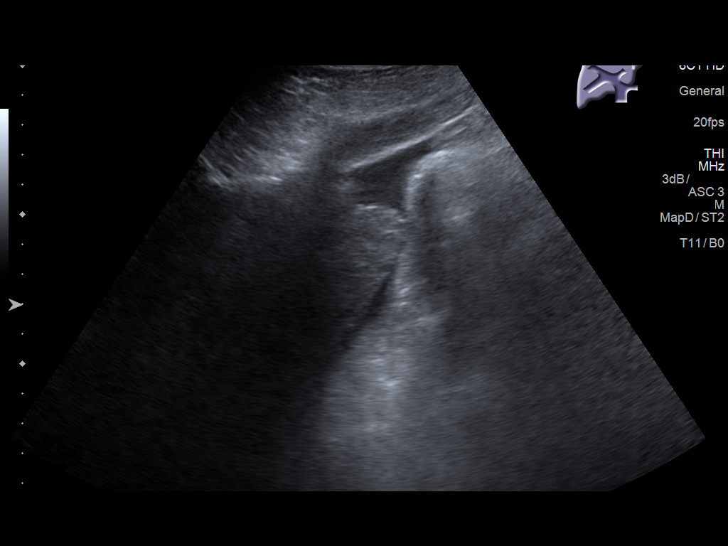
[im 23/50]
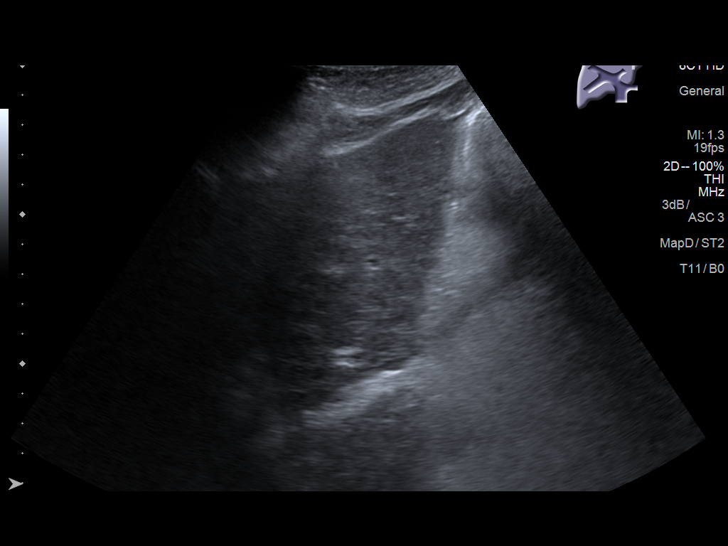
[im 27/50]
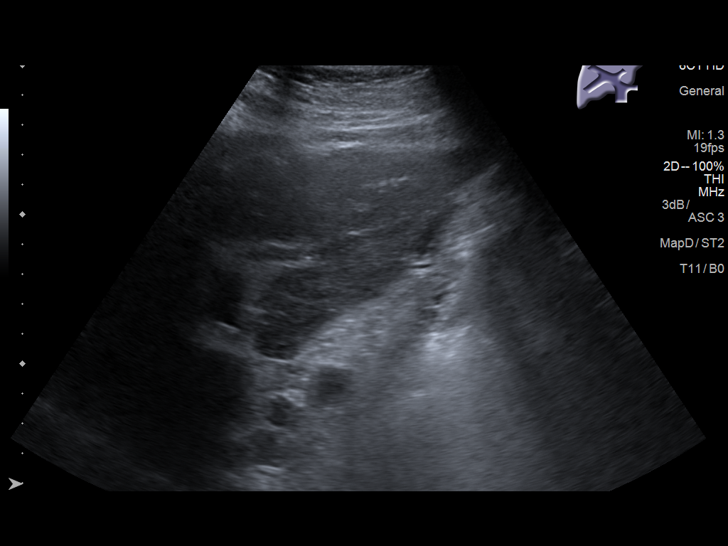
[im 31/50]
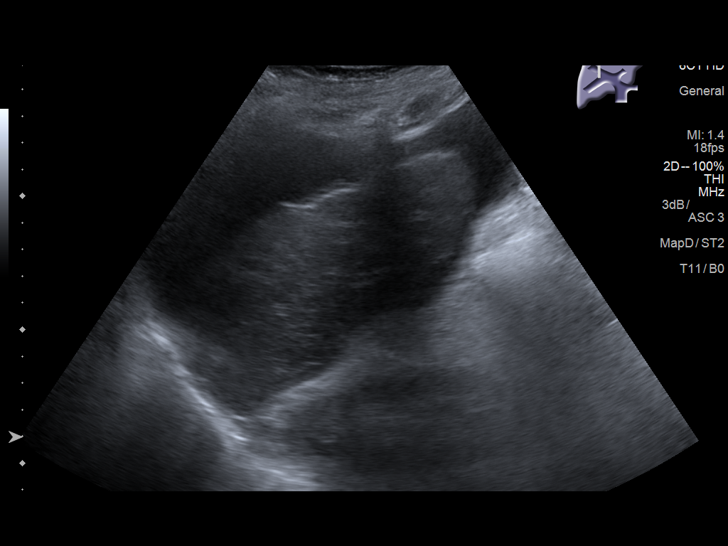
[im 33/50]
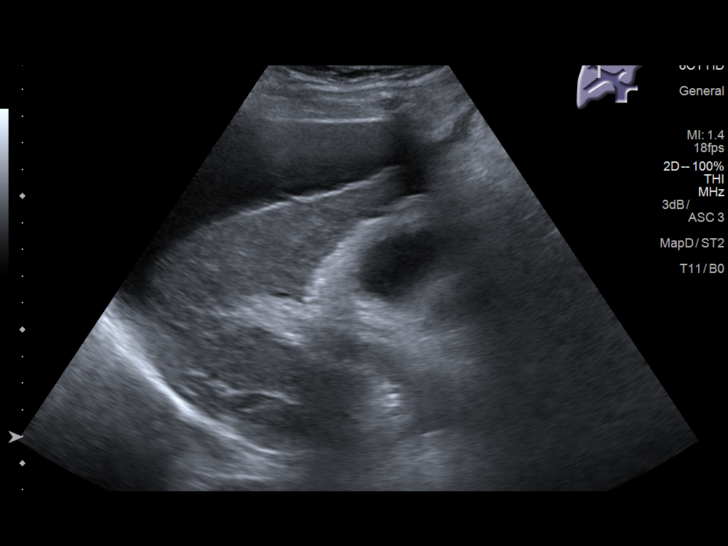
[im 37/50]
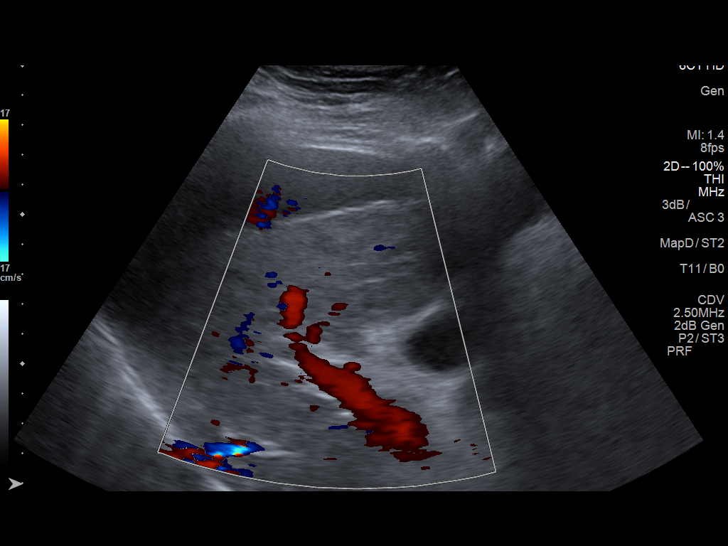
[im 41/50]
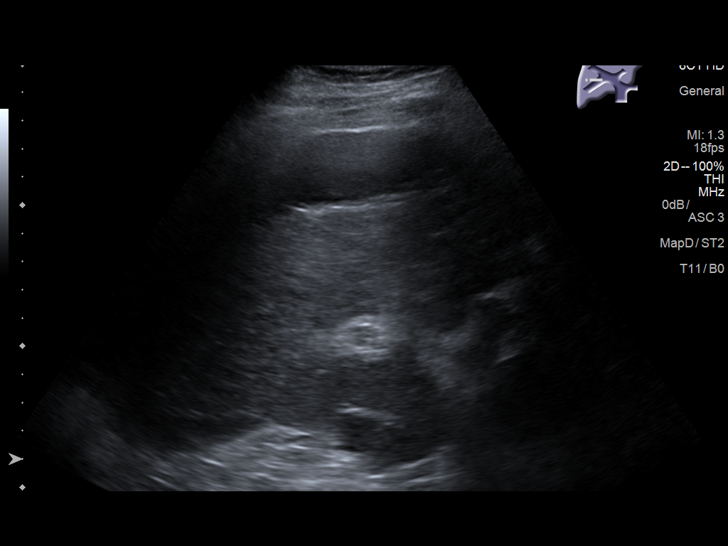
[im 45/50]
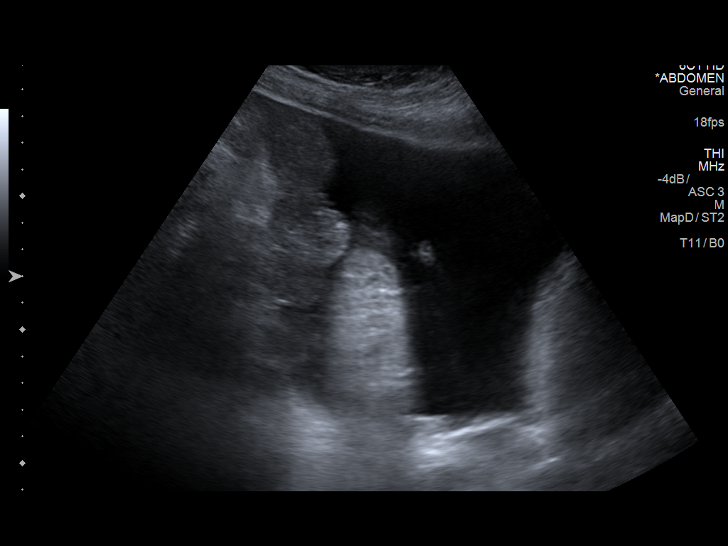
[im 50/50]
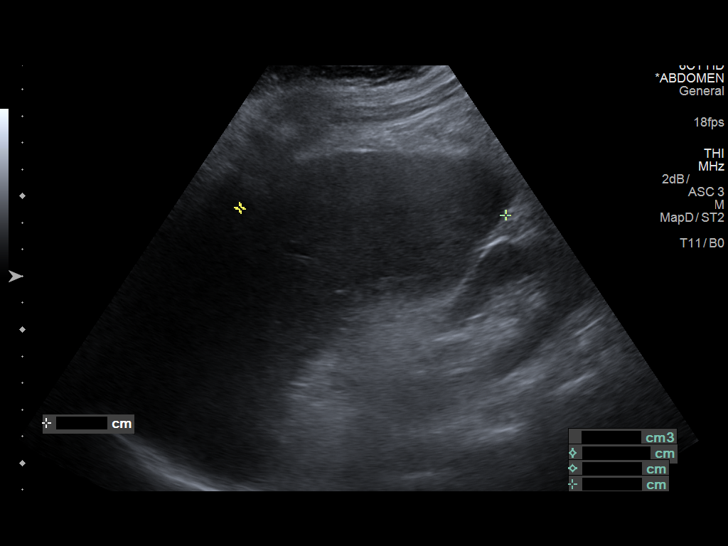

[14 of 25 positions shown; findings below may reference images not displayed]

FINDINGS: Gallbladder:

Mild to moderate cholelithiasis with the largest stone measuring
mm. There is gallbladder wall thickening measuring 8.4 mm. Negative
sonographic Murphy sign.

Common bile duct:

Diameter: 3 mm.

Liver:

Nodular contour to the liver. Mild increased parenchymal
echogenicity without focal mass. Portal vein is patent on color
Doppler imaging with normal direction of blood flow towards the
liver.

Mild to moderate ascites.  Mild splenomegaly.
IMPRESSION: Mild to moderate cholelithiasis. Nonspecific gallbladder wall
thickening likely related to the mild to moderate ascites.

Findings suggesting cirrhosis with mild to moderate ascites and mild
splenomegaly.

## 2021-03-05 ENCOUNTER — Other Ambulatory Visit: Payer: Self-pay | Admitting: Physician Assistant

## 2021-03-05 DIAGNOSIS — K7460 Unspecified cirrhosis of liver: Secondary | ICD-10-CM

## 2021-03-05 DIAGNOSIS — K7581 Nonalcoholic steatohepatitis (NASH): Secondary | ICD-10-CM

## 2021-03-13 ENCOUNTER — Other Ambulatory Visit: Payer: Self-pay | Admitting: Interventional Radiology

## 2021-03-13 ENCOUNTER — Encounter: Payer: Self-pay | Admitting: *Deleted

## 2021-03-13 ENCOUNTER — Ambulatory Visit
Admission: RE | Admit: 2021-03-13 | Discharge: 2021-03-13 | Disposition: A | Discharge status: Home / Self Care | Payer: Managed Care, Other (non HMO) | Source: Ambulatory Visit | Attending: Physician Assistant | Admitting: Physician Assistant

## 2021-03-13 DIAGNOSIS — K7581 Nonalcoholic steatohepatitis (NASH): Secondary | ICD-10-CM

## 2021-03-13 DIAGNOSIS — K7460 Unspecified cirrhosis of liver: Secondary | ICD-10-CM

## 2021-03-13 HISTORY — PX: IR RADIOLOGIST EVAL & MGMT: IMG5224

## 2021-03-13 NOTE — H&P (Signed)
Interventional Radiology - Clinic Visit, Initial H&P    Referring Provider (current admission): [place], Georgiana Spinner*  Reason for Visit: Recurrent ascites    History of Present Illness  Rita Kemp is a 75 y.o. 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 She has had recent more rapid re-accumulation of ascites in the past few months. Her first paracentesis was almost a year ago, but now she had 2 paracentesis within 1 month apart, and feels like she needs another one soon with her last paracentesis on 10/4 with ~8L removed. She is 2 diuretics currently and tries to follow best diet recommendations, but recently her diuretics had to be decreased due to muscle cramping which is limiting.   She denies any history of variceal bleeding or hepatic encephalopathy. She does note having had banding of some esophageal varices in the past.    Additional Past Medical History Past Medical History:  Diagnosis Date   Cirrhosis of liver not due to alcohol ([place])    Diabetes mellitus without complication ([place])    Insomnia    Thrombocytopenia ([place])      Surgical History  Past Surgical History:  Procedure Laterality Date   CATARACT EXTRACTION     RETINAL DETACHMENT SURGERY       Medications  I have reviewed the current medication list. Refer to chart for details. Current Outpatient Medications  Medication Instructions   EVENING PRIMROSE OIL PO Every morning   furosemide (LASIX) 20 mg, Oral, Daily   glipiZIDE (GLUCOTROL) 10 mg   metFORMIN (GLUCOPHAGE) 1,000 mg, 2 times daily with meals   Multiple Vitamins-Minerals (MULTIVITAL PO) Every morning   zolpidem (AMBIEN) 5 mg, At bedtime PRN      Allergies Allergies  Allergen Reactions   Aspirin Other (See Comments)    Contraindicated due to low platelets  Contraindicated  due to low platelets     Amoxicillin Rash   Does patient have contrast allergy: No     Physical Exam Current Vitals   ( )  Pulse Rate: 80     BP: (!) 127/55  SpO2: 97 %        There is no height or weight on file to calculate BMI.  General: Alert and answers questions appropriately. No apparent distress. HEENT: Normocephalic, atraumatic. Conjunctivae normal without scleral icterus. Cardiac: Regular rate.  Pulmonary: Normal work of breathing. On room air. Abdominal: Distended.  Extremities: Normally-formed, well perfused.    Pertinent Lab Results 09/30 labs - MELD-Na 9 (per referring GI note)    Relevant and/or Recent Imaging: [place] 10/22- shows patent PV, no hepatoma  TTE 03/22 - Normal EF  No recent CT A/P    Assessment & Plan Rita Kemp is a 75 y.o. female with a history of NASH cirrhosis and recurrent ascites who was referred to [facility] in consultation for further evaluation and management of ascites and TIPS creation.  Treatment options for her recurrent ascites include serial paracentesis or TIPS creation. She has maxed out best medical therapy. She is interested in TIPS. Based on her presentation today, which is notable for MELD 9, normal EF, and patent PV on recent US, and no baseline HE,  I think she is a good candidate for TIPS creation. I would like to get a CT with contrast to study anatomy prior to proceeding. These were discussed with the patient, and she  is interested in proceeding. We discussed the details of the procedure and its risks and benefits to the patient, who voiced his/her understanding.   Risk stratification: ASA Classification: ASA 3 - Patient with moderate systemic disease with functional limitations    Pre-procedure planning:  Post-procedure disposition: inpatient; overnight stay with GI consult   Sedation plan: general anesthesia Medication holds: n/a  Positioning/access site: Right IJ and CFV access   Foley catheter needed:  Yes Contrast premedication: No Labs needed on or before procedure day: CBC, CMP   Other: General anesthesia, ICE catheter, 2nd IR provider    Plan: CT A/P with IV contrast  Tentative plan for TIPS pending CT results       I spent a total of  30 Minutes  in face-to-face in clinical consultation, greater than 50% of which was spent on medical decision-making and counseling/coordinating care for TIPS.     Albin Felling, MD  Vascular and Interventional Radiology 03/13/2021 9:28 AM

## 2021-03-28 ENCOUNTER — Other Ambulatory Visit: Payer: Self-pay | Admitting: *Deleted

## 2021-03-28 DIAGNOSIS — K760 Fatty (change of) liver, not elsewhere classified: Secondary | ICD-10-CM

## 2021-03-30 ENCOUNTER — Other Ambulatory Visit: Payer: Self-pay | Admitting: Interventional Radiology

## 2021-03-30 DIAGNOSIS — K760 Fatty (change of) liver, not elsewhere classified: Secondary | ICD-10-CM

## 2021-04-03 ENCOUNTER — Ambulatory Visit
Admission: RE | Admit: 2021-04-03 | Discharge: 2021-04-03 | Disposition: A | Discharge status: Home / Self Care | Payer: Managed Care, Other (non HMO) | Source: Ambulatory Visit | Attending: Interventional Radiology | Admitting: Interventional Radiology

## 2021-04-03 ENCOUNTER — Other Ambulatory Visit: Payer: Self-pay

## 2021-04-03 ENCOUNTER — Encounter: Payer: Self-pay | Admitting: *Deleted

## 2021-04-03 DIAGNOSIS — K760 Fatty (change of) liver, not elsewhere classified: Secondary | ICD-10-CM

## 2021-04-03 HISTORY — PX: IR RADIOLOGIST EVAL & MGMT: IMG5224

## 2021-04-03 NOTE — Progress Notes (Signed)
Interventional Radiology - Telephone Visit    History of Present Illness  Rita Kemp is a 75 y.o. 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  We confirmed identity with 2 personal identifiers.   The patient is following up today over the phone from her last visit on November 1st.  In the interim, she remains essentially in her similar state of health but continues to complain of rapid fluid accumulation in her belly and discomfort associated with it.  She has also had a CT scan of the abdomen which was performed on March 29, 2021.  I personally reviewed the images, which demonstrate a shrunken and nodular liver compatible with advanced cirrhosis and large volume ascites.  The hepatic veins are diminutive but appear patent.  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  The intrahepatic portal veins are widely patent.  Additionally, prominent esophageal varices are noted.    Past medical and surgical history reviewed. No interval changes. No interval hospitalizations.   Medications  I have reviewed the current medication list. Refer to chart for details. Current Outpatient Medications  Medication Instructions   EVENING PRIMROSE OIL PO Every morning   furosemide (LASIX) 20 mg, Oral, Daily   glipiZIDE (GLUCOTROL) 10 mg   metFORMIN (GLUCOPHAGE) 1,000 mg, 2 times daily with meals   Multiple Vitamins-Minerals (MULTIVITAL PO) Every morning   zolpidem (AMBIEN) 5 mg, At bedtime PRN       Pertinent Lab Results CBC Latest Ref Rng & Units 03/04/2018 11/20/2017 05/23/2017  WBC 4.0 - 10.5 K/uL 3.0(L) 2.4(L) 2.2(L)  Hemoglobin 12.0 - 15.0 g/dL 13.1 12.3 12.4  Hematocrit 36.0 - 46.0 % 40.1 36.8 36.2  Platelets 150 - 400 K/uL 56(L) 46(L) 49(L)   CMP Latest Ref Rng & Units 03/04/2018 11/20/2017  08/22/2014  Glucose 70 - 99 mg/dL 169(H) 263(H) 202(H)  BUN 8 - 23 mg/dL 8 6(L) 9  Creatinine 0.44 - 1.00 mg/dL 0.56 0.40(L) 0.53  Sodium 135 - 145 mmol/L 140 139 140  Potassium 3.5 - 5.1 mmol/L 3.6 3.9 4.0  Chloride 98 - 111 mmol/L 107 103 106  CO2 22 - 32 mmol/L 21(L) 29 25  Calcium 8.9 - 10.3 mg/dL 9.4 9.0 8.8  Total Protein 6.5 - 8.1 g/dL 7.4 6.6 6.6  Total Bilirubin 0.3 - 1.2 mg/dL 1.8(H) 1.5 1.0  Alkaline Phos 38 - 126 U/L 88 117(H) 83  AST 15 - 41 U/L 44(H) 48(H) 37  ALT 0 - 44 U/L 24 34 30     Relevant and/or Recent Imaging: As discussed in HPI    Assessment & Plan Rita Kemp is a 75 y.o. 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   In the interim since her initial visit on November 1, she has had a CT scan of the abdomen and pelvis which demonstrates amenable anatomy for TIPS creation. Primary goal of TIPS is to reduce ascites. Secondary goals include treatment of PV thrombus and decompression of esophageal varices.   This was all discussed with the patient, who is agreeable to proceed.    Plan:  1. TIPS creation, possible PV thrombectomy, possible esophageal varices embolization    Risk stratification: ASA Classification: ASA 3 - Patient with moderate systemic disease  with functional limitations    Pre-procedure planning:  Post-procedure disposition: inpatient; overnight stay with GI consult   Sedation plan: general anesthesia Medication holds: n/a  Positioning/access site: Right IJ and CFV access   Foley catheter needed: Yes Contrast premedication: No Labs needed on or before procedure day: CBC, CMP   Other: General anesthesia, ICE catheter, 2nd IR provider    I spent a total of  15 Minutes in face-to-face in clinical consultation, greater than 50% of which was spent on medical  decision-making and counseling/coordinating care for TIPS creation.   Visit type: Audio only (telephone). Audio (no video) only due to lack of video capability from patient. Alternative for in-person consultation at [facility], [address] [place], [place], [place]. This visit type was conducted due to national recommendations for restrictions regarding the COVID-19 Pandemic (e.g. social distancing).  This format is felt to be most appropriate for this patient at this time.  All issues noted in this document were discussed and addressed.      Albin Felling, MD  Vascular and Interventional Radiology 04/03/2021 12:20 PM

## 2021-04-27 ENCOUNTER — Other Ambulatory Visit: Payer: Self-pay | Admitting: Interventional Radiology

## 2021-04-27 DIAGNOSIS — K7460 Unspecified cirrhosis of liver: Secondary | ICD-10-CM

## 2021-05-17 ENCOUNTER — Other Ambulatory Visit: Payer: Self-pay | Admitting: Interventional Radiology

## 2021-05-17 DIAGNOSIS — K7581 Nonalcoholic steatohepatitis (NASH): Secondary | ICD-10-CM

## 2021-05-17 DIAGNOSIS — K7460 Unspecified cirrhosis of liver: Secondary | ICD-10-CM

## 2021-06-01 ENCOUNTER — Ambulatory Visit
Admission: RE | Admit: 2021-06-01 | Discharge: 2021-06-01 | Disposition: A | Discharge status: Home / Self Care | Payer: 59 | Source: Ambulatory Visit | Attending: Interventional Radiology | Admitting: Interventional Radiology

## 2021-06-01 ENCOUNTER — Other Ambulatory Visit: Payer: Self-pay

## 2021-06-01 ENCOUNTER — Encounter: Payer: Self-pay | Admitting: *Deleted

## 2021-06-01 DIAGNOSIS — K7581 Nonalcoholic steatohepatitis (NASH): Secondary | ICD-10-CM

## 2021-06-01 DIAGNOSIS — K7460 Unspecified cirrhosis of liver: Secondary | ICD-10-CM

## 2021-06-01 HISTORY — PX: IR RADIOLOGIST EVAL & MGMT: IMG5224

## 2021-06-01 NOTE — Progress Notes (Signed)
Chief Complaint: Patient was seen in consultation today for cirrhosis complicated by ascites now status post TIPS at the request of Victoria Henshaw K  Referring Physician(s): Delesha Pohlman K  History of Present Illness: Rita Kemp is a 76 y.o. 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 She has had recent more rapid re-accumulation of ascites in the past few months. Her first paracentesis was almost a year ago, but now she had 2 paracentesis within 1 month apart, and feels like she needs another one soon with her last paracentesis on 10/4 with ~8L removed. She is 2 diuretics currently and tries to follow best diet recommendations, but recently her diuretics had to be decreased due to muscle cramping which is limiting.    She had no history of variceal bleeding or hepatic encephalopathy. She does note having had banding of some esophageal varices in the past.  She underwent creation of a right hepatic to right portal venous TIPS on 04/26/2021 at [facility].  Unfortunately, following induction of anesthesia she developed significant hypotension requiring pressor support.  This hypotension and the need for pressor support continued for several days after the procedure resulting in a relatively prolonged hospital stay.  Ultimately, she was discharged home in good condition.  Since the time of her procedure she has not required an additional paracentesis.  She is currently taking lactulose as well as rifaximin once daily in the morning.  This allows her to have 2-3 soft bowel movements daily.  She has had no issues with hepatic encephalopathy.  She reports that her abdomen feels good and not overly distended.  She does complain that she has lower extremity edema on most days.  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 Authorizing Provider  EVENING PRIMROSE OIL PO Take by mouth every morning.    [provider]  furosemide (LASIX) 20 MG tablet Take 1 tablet (20 mg total) by mouth daily. 03/04/18   Davonna Belling, MD  glipiZIDE (GLUCOTROL) 10 MG tablet Take 10 mg by mouth. 09/29/13 09/29/14  [provider]  metFORMIN (GLUCOPHAGE) 1000 MG tablet 1,000 mg 2 (two) times daily with a meal. PT ONLY TAKES ONCE DAILY 10/14/13   [provider]  Multiple Vitamins-Minerals (MULTIVITAL PO) Take by mouth every morning.    [provider]  zolpidem (AMBIEN) 10 MG tablet Take 5 mg by mouth at bedtime as needed.  10/25/13   [provider]     No family history on file.  Social History   Socioeconomic History   Marital status: Married    Spouse name: Not on file   Number of children: Not on file   Years of education: Not on file   Highest education level: Not on file  Occupational History  Not on file  Tobacco Use   Smoking status: Never   Smokeless tobacco: Never   Tobacco comments:    never used tobacco  Substance and Sexual Activity   Alcohol use: Not on file   Drug use: Not on file   Sexual activity: Not on file  Other Topics Concern   Not on file  Social History Narrative   Not on file   Social Determinants of Health   Financial Resource Strain: Not on file  Food Insecurity: Not on file  Transportation Needs: Not on file  Physical  Activity: Not on file  Stress: Not on file  Social Connections: Not on file    Review of Systems: A 12 point ROS discussed and pertinent positives are indicated in the HPI above.  All other systems are negative.  Review of Systems  Vital Signs: There were no vitals taken for this visit.  Physical Exam Constitutional:      General: She is not in acute distress.    Appearance: Normal appearance.  HENT:     Head: Normocephalic and atraumatic.  Eyes:     General: Scleral icterus present.  Cardiovascular:     Rate and Rhythm: Normal rate.  Pulmonary:     Effort: Pulmonary effort is normal.  Abdominal:     General: There is no distension.     Tenderness: There is no abdominal tenderness. There is no guarding.  Skin:    General: Skin is warm and dry.  Neurological:     Mental Status: She is alert and oriented to person, place, and time.     Comments: No asterixis  Psychiatric:        Mood and Affect: Mood normal.        Behavior: Behavior normal.     Imaging: IR Radiologist Eval & Mgmt  Result Date: 06/01/2021 Please refer to notes tab for details about interventional procedure. (Op Note)   Labs:  CBC: No results for input(s): WBC, HGB, HCT, PLT in the last 8760 hours.  COAGS: No results for input(s): INR, APTT in the last 8760 hours.  BMP: No results for input(s): NA, K, CL, CO2, GLUCOSE, BUN, CALCIUM, CREATININE, GFRNONAA, GFRAA in the last 8760 hours.  Invalid input(s): CMP  LIVER FUNCTION TESTS: No results for input(s): BILITOT, AST, ALT, ALKPHOS, PROT, ALBUMIN in the last 8760 hours.  TUMOR MARKERS: No results for input(s): AFPTM, CEA, CA199, CHROMGRNA in the last 8760 hours.  Assessment and Plan:  Very pleasant 76 year old female who underwent TIPS creation on 04/26/2021.  Unfortunately, her postprocedural recovery was complicated by a lengthy stay in the hospital due to persistent hypotension.  Ultimately, she was discharged home in good condition.  She  has since done well and has not required additional paracentesis since the time of TIPS creation.  She is compliant with her lactulose and rifaximin and is currently doing well without evidence of encephalopathy.  She came in with her family.  I was able to answer all of their questions.  They have a good understanding of her current status and the importance of both the lactulose and rifaximin.  Her only real complaint is lower extremity edema.  1.)  Consider over-the-counter compression hose for lower extremity edema.  Additionally, she is to follow-up with her primary care physician.  A slight increase in her Lasix dose may be helpful.  Additionally, I suspect as her protein levels rebound now that she is no longer requiring large-volume paracentesis that the edema may  also improve over time.  2.)  Next surveillance TIPS duplex ultrasound and clinic visit in 3 months.    Electronically Signed: Criselda Peaches 06/01/2021, 12:23 PM   I spent a total of   25 Minutes in face to face in clinical consultation, greater than 50% of which was counseling/coordinating care for cirrhosis complicated by ascites now status post TIPS

## 2021-07-24 ENCOUNTER — Other Ambulatory Visit: Payer: Self-pay | Admitting: Interventional Radiology

## 2021-07-24 DIAGNOSIS — K7460 Unspecified cirrhosis of liver: Secondary | ICD-10-CM

## 2021-08-16 ENCOUNTER — Ambulatory Visit
Admission: RE | Admit: 2021-08-16 | Discharge: 2021-08-16 | Disposition: A | Discharge status: Home / Self Care | Payer: 59 | Source: Ambulatory Visit | Attending: Interventional Radiology | Admitting: Interventional Radiology

## 2021-08-16 ENCOUNTER — Encounter: Payer: Self-pay | Admitting: *Deleted

## 2021-08-16 DIAGNOSIS — K7460 Unspecified cirrhosis of liver: Secondary | ICD-10-CM

## 2021-08-16 HISTORY — PX: IR RADIOLOGIST EVAL & MGMT: IMG5224

## 2021-08-16 NOTE — Progress Notes (Signed)
? ? ?Chief Complaint: ?Patient was seen in consultation today for cirrhosis complicated by ascites now status post TIPS  at the request of Emaline Karnes K ? ?Referring Physician(s): ?Jolonda Gomm K ? ?History of Present Illness: ?Rita Kemp is a 76 y.o. 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 She has had recent more rapid re-accumulation of ascites in the past few months. Her first paracentesis was almost a year ago, but now she had 2 paracentesis within 1 month apart, and feels like she needs another one soon with her last paracentesis on 10/4 with ~8L removed. She is 2 diuretics currently and tries to follow best diet recommendations, but recently her diuretics had to be decreased due to muscle cramping which is limiting.  ?  ?She had no history of variceal bleeding or hepatic encephalopathy. She does note having had banding of some esophageal varices in the past. ?  ?She underwent creation of a right hepatic to right portal venous TIPS on 04/26/2021 at [facility].  Unfortunately, following induction of anesthesia she developed significant hypotension requiring pressor support.  This hypotension and the need for pressor support continued for several days after the procedure resulting in a relatively prolonged hospital stay.  Ultimately, she was discharged home in good condition. ? ?Since the time of her procedure nearly 4 months ago she has not required an additional paracentesis!  She is extremely pleased by this.  She is currently taking lactulose as well as rifaximin once daily in the morning.  This allows her to have 2-3 soft bowel movements daily.  She has had no issues with hepatic encephalopathy.  She reports that her abdomen feels good and not overly distended.   ? ?Her lower extremity  edema is also improved.  When she stands for a long time she continues to get edema in the lower leg and ankle, but this is improved fairly easily by elevation.  She denies melena, blood per rectum or hematemesis. ? ?Past Medical History:  ?Diagnosis Date  ? 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 Diabetes mellitus without complication ([place])   ? Insomnia   ? Thrombocytopenia ([place])   ? ? ?Past Surgical History:  ?Procedure Laterality Date  ? CATARACT EXTRACTION    ? IR RADIOLOGIST EVAL & MGMT  03/13/2021  ? IR RADIOLOGIST EVAL & MGMT  04/03/2021  ? IR RADIOLOGIST EVAL & MGMT  06/01/2021  ? RETINAL DETACHMENT SURGERY    ? ? ?Allergies: ?Aspirin and Amoxicillin ? ?Medications: ?Prior to Admission medications   ?Medication Sig Start Date End Date Taking? Authorizing Provider  ?EVENING PRIMROSE OIL PO Take by mouth every morning.    [provider]  ?furosemide (LASIX) 20 MG tablet Take 1 tablet (20 mg total) by mouth daily. 03/04/18   Davonna Belling, MD  ?glipiZIDE (GLUCOTROL) 10 MG tablet Take 10 mg by mouth. 09/29/13 09/29/14  [provider]  ?metFORMIN (GLUCOPHAGE) 1000 MG tablet 1,000 mg 2 (two) times daily with a meal. PT ONLY TAKES ONCE DAILY 10/14/13   [provider]  ?Multiple Vitamins-Minerals (MULTIVITAL PO) Take by mouth every morning.    [provider]  ?zolpidem (AMBIEN) 10 MG tablet Take 5 mg by mouth at bedtime as needed.  10/25/13   [provider]  ?  ? ?No family history on file. ? ?Social  History  ? ?Socioeconomic History  ? Marital status: Married  ?  Spouse name: Not on file  ? Number of children: Not on file  ? Years of education: Not on file  ? Highest education level: Not on file  ?Occupational History  ? Not on file  ?Tobacco Use  ? Smoking status: Never  ? Smokeless tobacco: Never  ? Tobacco comments:  ?  never used tobacco  ?Substance and Sexual Activity  ? Alcohol use: Not on file  ? Drug use: Not on file  ? Sexual activity: Not on file   ?Other Topics Concern  ? Not on file  ?Social History Narrative  ? Not on file  ? ?Social Determinants of Health  ? ?Financial Resource Strain: Not on file  ?Food Insecurity: Not on file  ?Transportation Needs: Not on file  ?Physical Activity: Not on file  ?Stress: Not on file  ?Social Connections: Not on file  ? ? ?Review of Systems: A 12 point ROS discussed and pertinent positives are indicated in the HPI above.  All other systems are negative. ? ?Review of Systems ? ?Vital Signs: ?There were no vitals taken for this visit. ? ?Physical Exam ?Constitutional:   ?   Appearance: Normal appearance.  ?HENT:  ?   Head: Normocephalic and atraumatic.  ?Eyes:  ?   General: No scleral icterus. ?Cardiovascular:  ?   Rate and Rhythm: Normal rate.  ?Pulmonary:  ?   Effort: Pulmonary effort is normal.  ?Abdominal:  ?   General: There is no distension.  ?   Palpations: Abdomen is soft.  ?   Tenderness: There is no abdominal tenderness.  ?Skin: ?   General: Skin is warm and dry.  ?Neurological:  ?   Mental Status: She is alert and oriented to person, place, and time.  ?   Comments: No asterixis  ?Psychiatric:     ?   Mood and Affect: Mood normal.     ?   Behavior: Behavior normal.  ? ? ? ? ?Imaging: ?No results found. ? ?Labs: ? ?CBC: ?No results for input(s): WBC, HGB, HCT, PLT in the last 8760 hours. ? ?COAGS: ?No results for input(s): INR, APTT in the last 8760 hours. ? ?BMP: ?No results for input(s): NA, K, CL, CO2, GLUCOSE, BUN, CALCIUM, CREATININE, GFRNONAA, GFRAA in the last 8760 hours. ? ?Invalid input(s): CMP ? ?LIVER FUNCTION TESTS: ?No results for input(s): BILITOT, AST, ALT, ALKPHOS, PROT, ALBUMIN in the last 8760 hours. ? ?TUMOR MARKERS: ?No results for input(s): AFPTM, CEA, CA199, CHROMGRNA in the last 8760 hours. ? ?Assessment and Plan: ? ?Very pleasant 76 year old female who underwent TIPS creation on 04/26/2021.  Unfortunately, her postprocedural recovery was complicated by a lengthy stay in the hospital due to  persistent hypotension.  Ultimately, she was discharged home in good condition.  She has since done well and has not required additional paracentesis since the time of TIPS creation. ? ?She is compliant with her lactulose and rifaximin and is currently doing well without evidence of encephalopathy.  Her lower extremity edema is significantly improved compared to the last time I saw her.  She has not been using compression hose, I reminded her of that recommendation and her husband says they will get some to help with the mild residual edema. ?  ?1.)  Next surveillance TIPS duplex ultrasound and clinic visit in 6 months. ? ? ?Electronically Signed: ?Antonietta Jewel Linell Shawn ?08/16/2021, 11:57 AM ? ? ?I spent a total of  15 Minutes in face to face in clinical consultation, greater than 50% of which was counseling/coordinating care for cirrhosis complicated by ascites now status post TIPS. ? ?

## 2021-11-10 DEATH — deceased

## 2022-01-23 ENCOUNTER — Other Ambulatory Visit: Payer: Self-pay | Admitting: Urology

## 2022-01-23 ENCOUNTER — Other Ambulatory Visit: Payer: Self-pay | Admitting: Interventional Radiology

## 2022-01-23 DIAGNOSIS — K7460 Unspecified cirrhosis of liver: Secondary | ICD-10-CM

## 2022-02-04 ENCOUNTER — Encounter: Payer: Self-pay | Admitting: Interventional Radiology

## 2022-07-31 IMAGING — US US ART/VEN ABD/PELV/SCROTUM DOPPLER COMPLETE
1 series · 13 of 25 positions shown · non-contrast
Comparison: None.

CLINICAL DATA: 75-year-old female with NASH cirrhosis and recurrent
large volume ascites. She underwent tips creation on 04/26/2021 at
[REDACTED] and presents today for surveillance
imaging and follow-up.

EXAM:
DUPLEX ULTRASOUND OF LIVER AND TIPS SHUNT
TECHNIQUE: Color and duplex Doppler ultrasound was performed to evaluate the
hepatic in-flow and out-flow vessels.

[Series 1: us art/ven abd/pelv/scrotum doppler complete · 0.26mm/px · 13 of 30 slices shown]
[im 1/30]
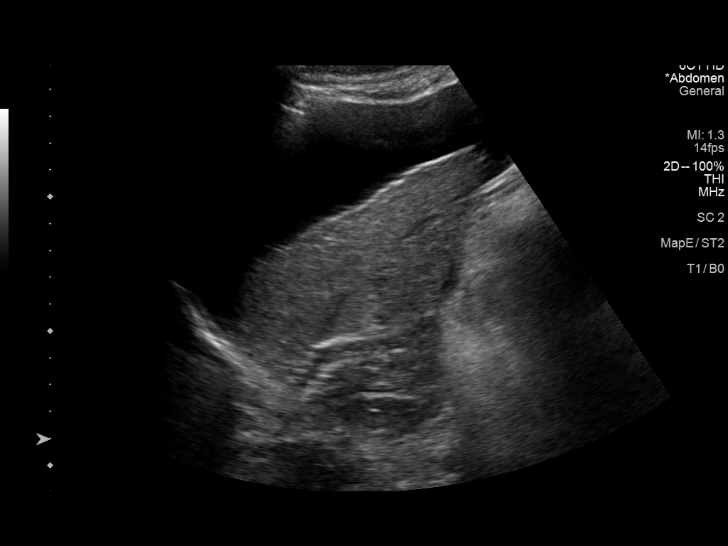
[im 3/30]
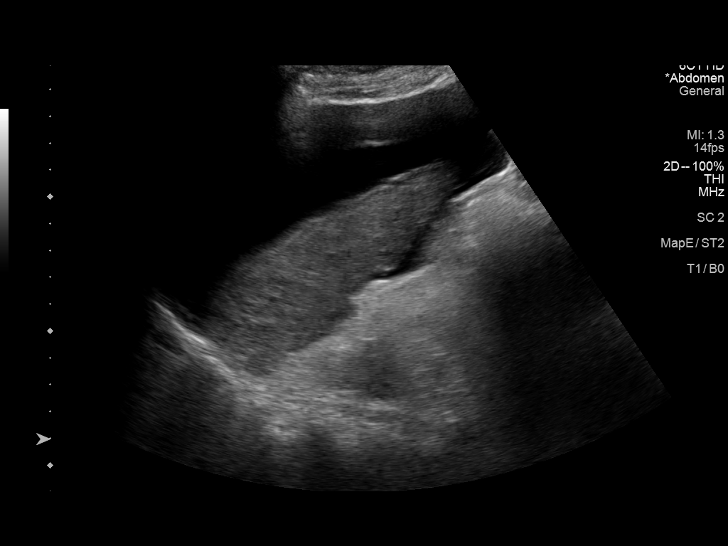
[im 5/30]
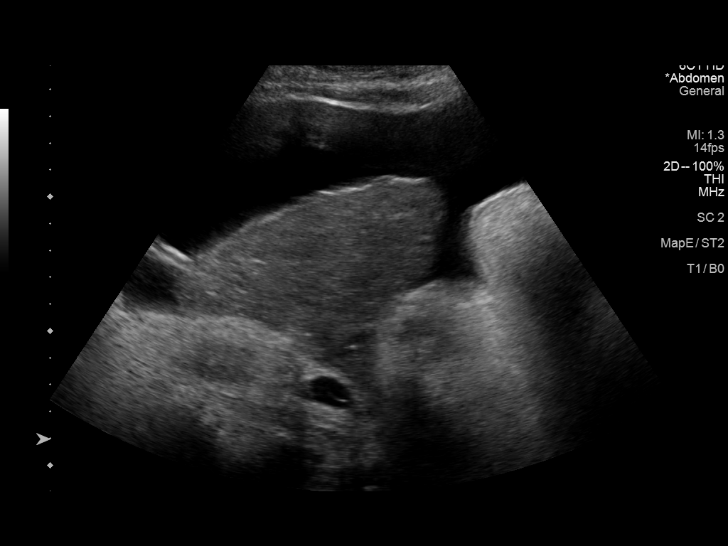
[im 8/30]
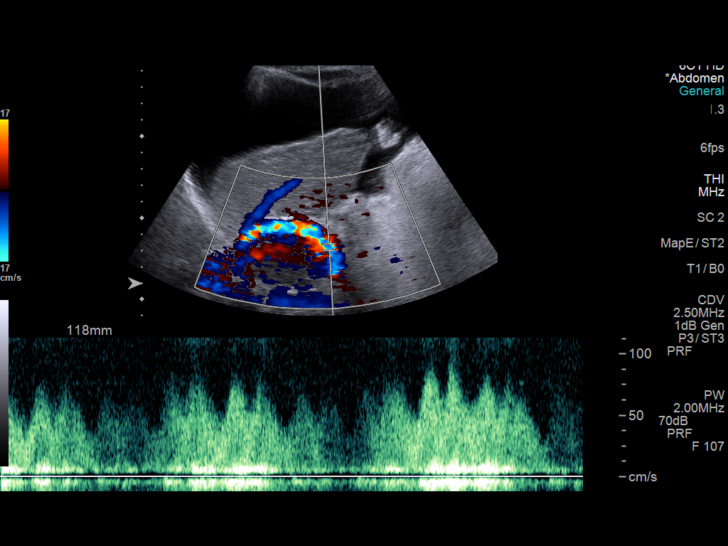
[im 10/30]
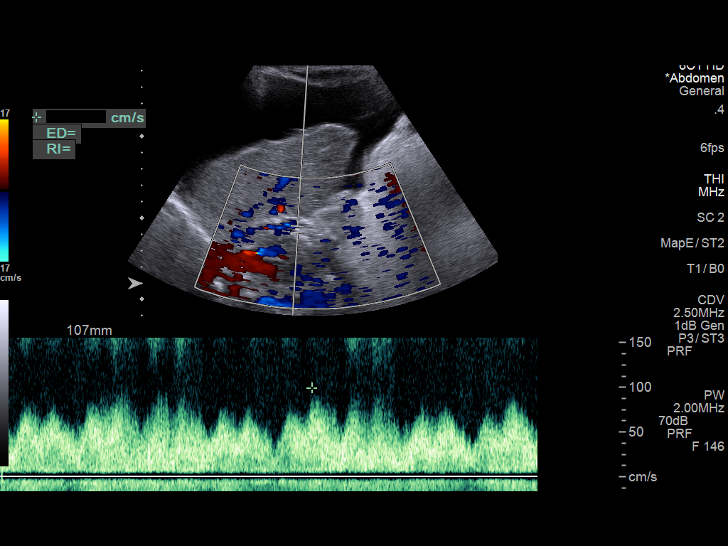
[im 13/30]
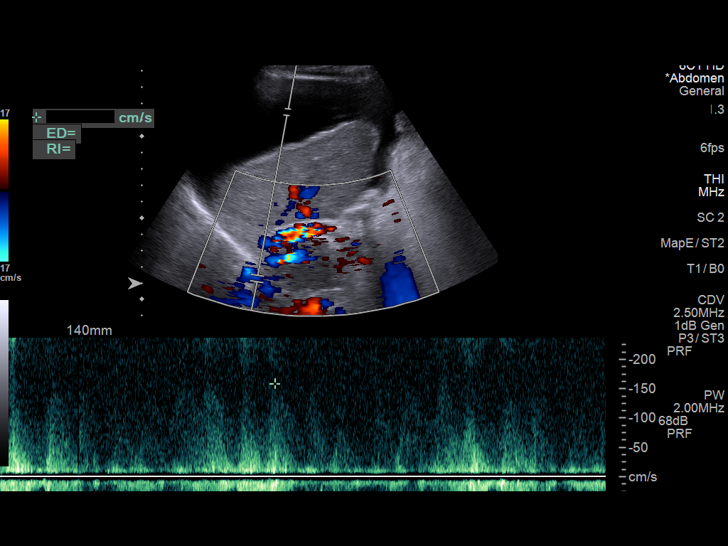
[im 15/30]
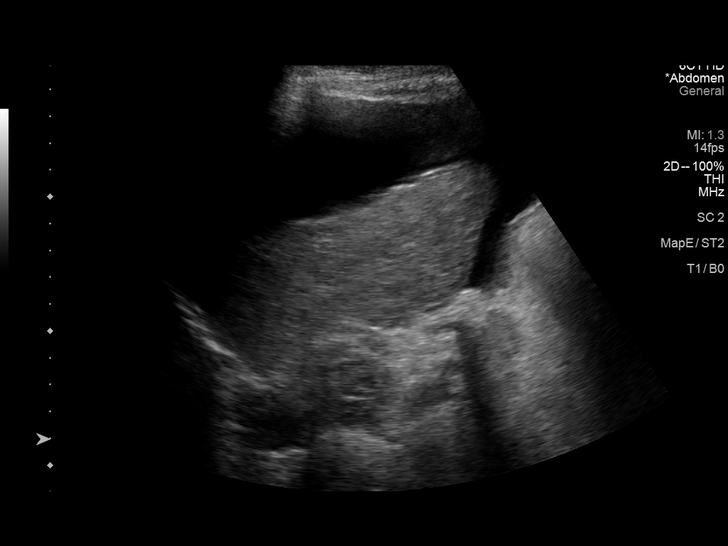
[im 17/30]
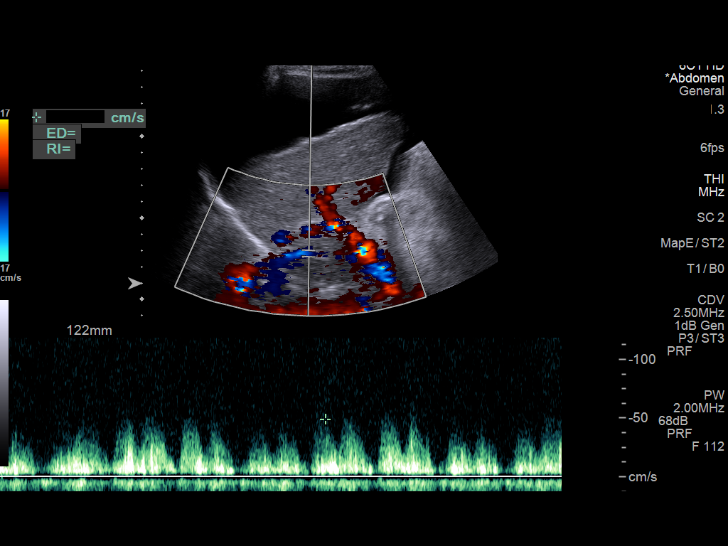
[im 20/30]
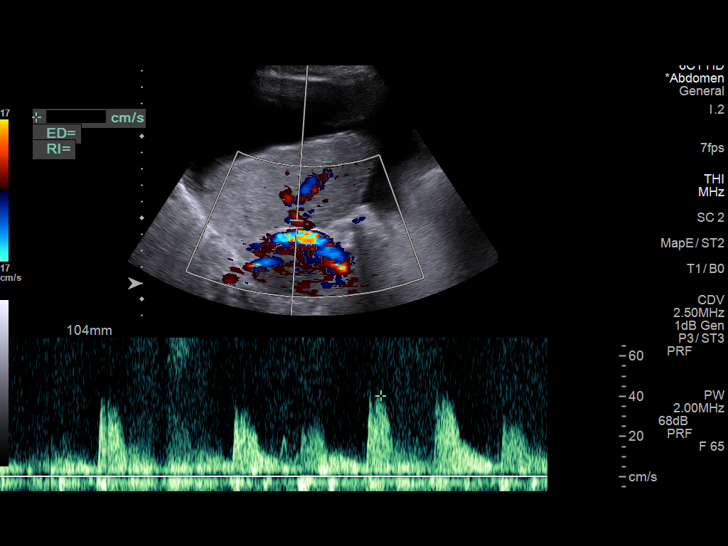
[im 22/30]
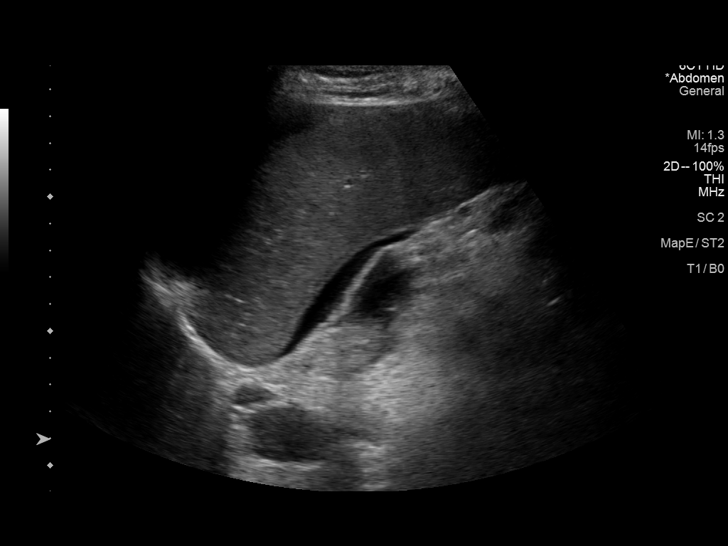
[im 25/30]
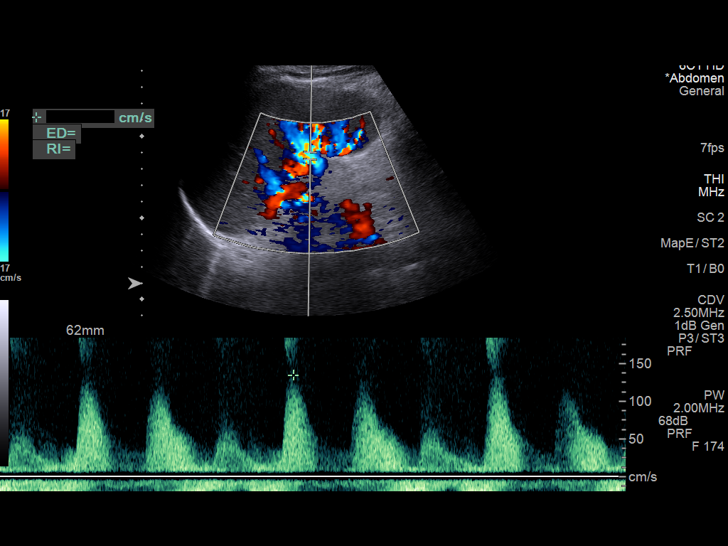
[im 27/30]
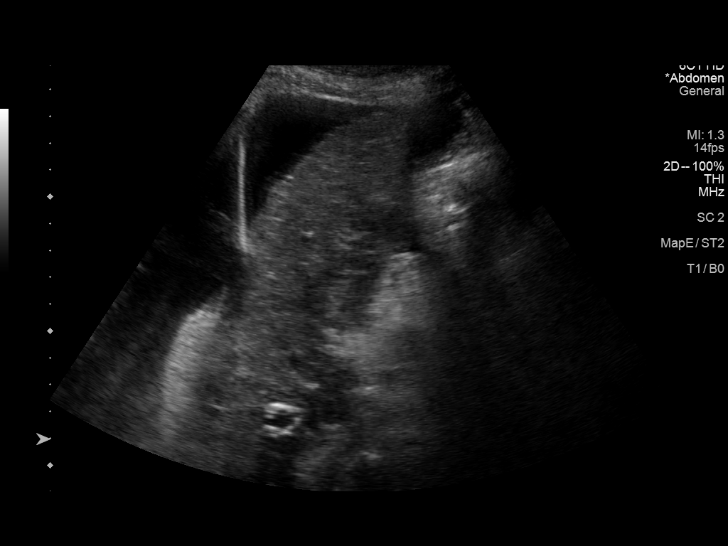
[im 30/30]
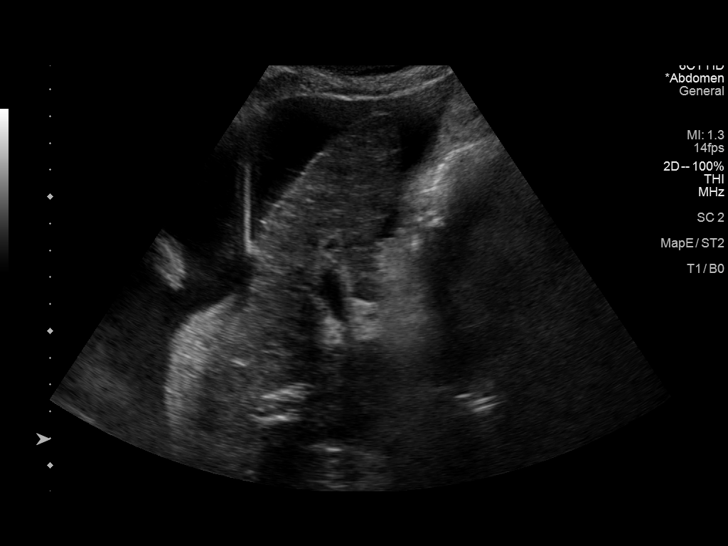

[13 of 25 positions shown; findings below may reference images not displayed]

FINDINGS: Portal Vein Velocities

Main:  88 cm/sec

Right:  14 cm/sec

Left:  52 cm/sec

TIPS Stent Velocities

Proximal:  99 cm/sec

Mid:  129

Distal:  178 cm/sec

IVC: Present and patent with normal respiratory phasicity.

Hepatic Vein Velocities

Right:  15 cm/sec

Mid:  49 cm/sec

Left:  49 cm/sec

Splenic Vein: 134 cm per second

Superior Mesenteric Vein: Unable to visualize

Hepatic Artery: 40 cm/second

Ascites: Moderate ascites.

Varices: Present/Absent

Other findings: Diffusely heterogeneous and nodular liver consistent
with known cirrhosis. No discrete lesions are identified. Small to
moderate right-sided pleural effusion.
IMPRESSION: 1. Patent tips.
2. Moderate ascites.
3. Small to moderate right pleural effusion.
4. Cirrhotic liver without evidence of focal lesion.

## 2022-10-15 IMAGING — US US ART/VEN ABD/PELV/SCROTUM DOPPLER COMPLETE
1 series · 14 of 25 positions shown · non-contrast
Comparison: Prior tips ultrasound 06/01/2021

CLINICAL DATA: History of NASH cirrhosis status post tips creation

EXAM:
DUPLEX ULTRASOUND OF LIVER AND TIPS SHUNT
TECHNIQUE: Color and duplex Doppler ultrasound was performed to evaluate the
hepatic in-flow and out-flow vessels.

[Series 1: us art/ven abd/pelv/scrotum doppler complete · 0.23mm/px · 14 of 47 slices shown]
[im 1/47]
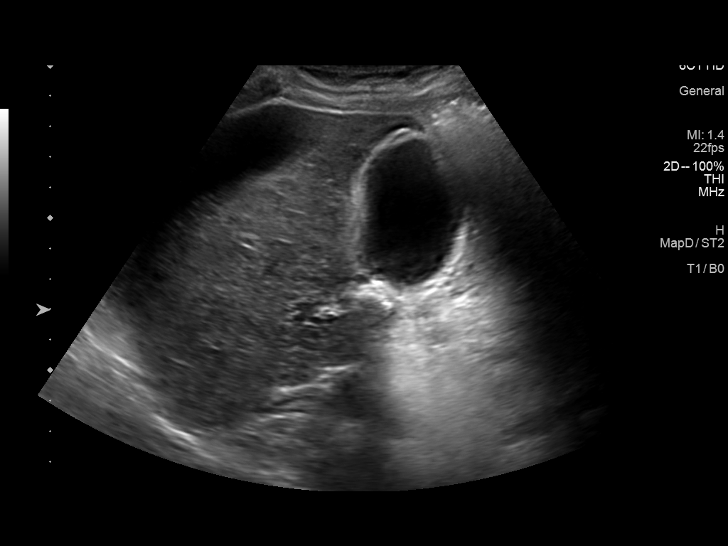
[im 4/47]
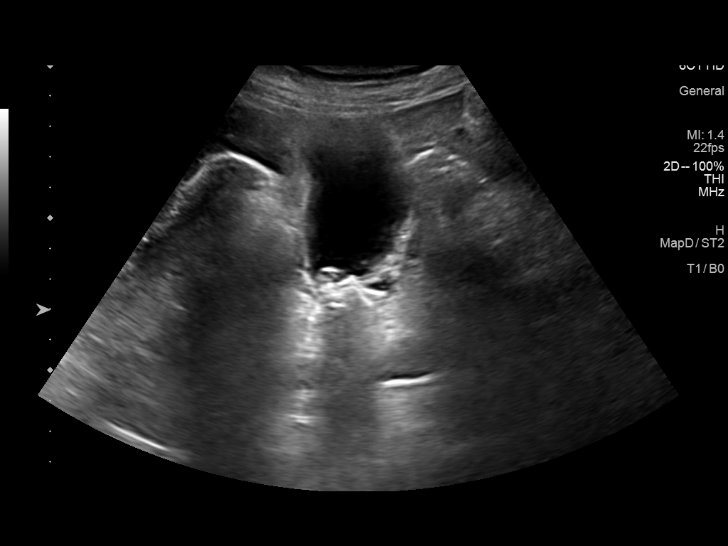
[im 8/47]
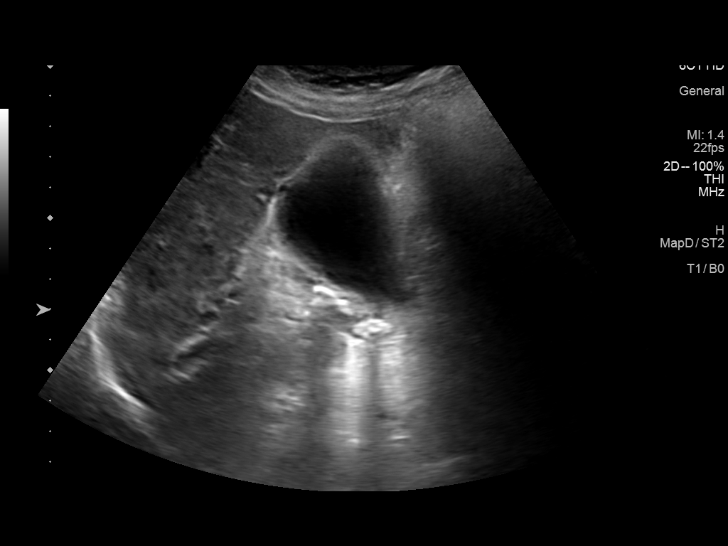
[im 12/47]
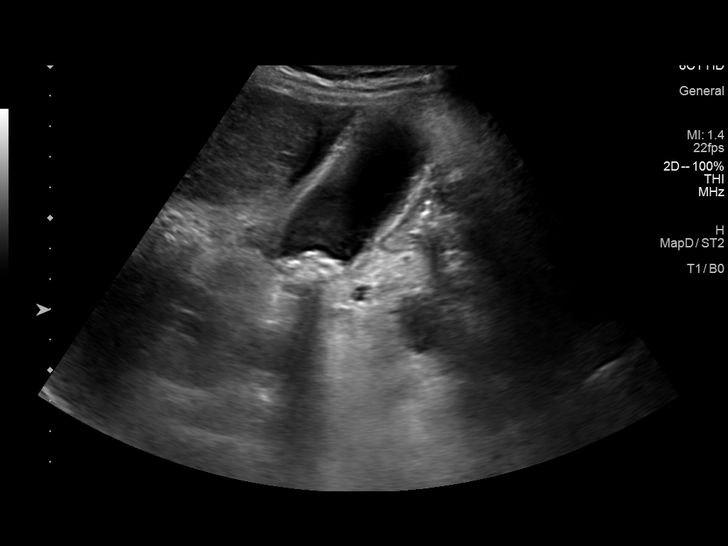
[im 16/47]
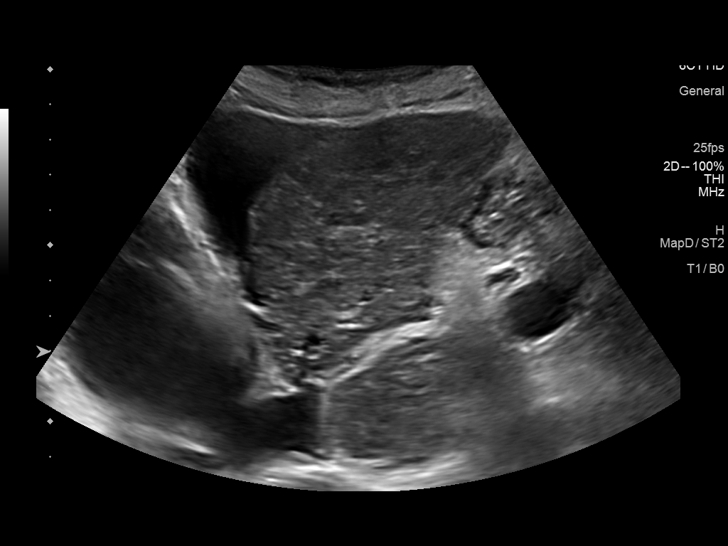
[im 18/47]
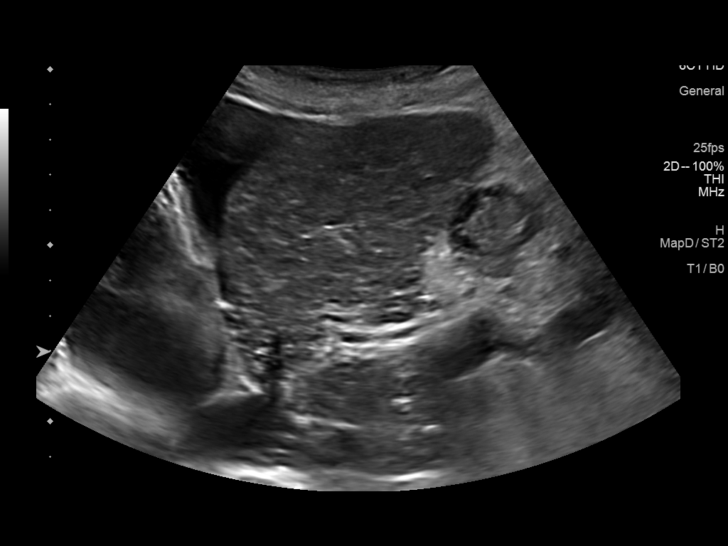
[im 22/47]
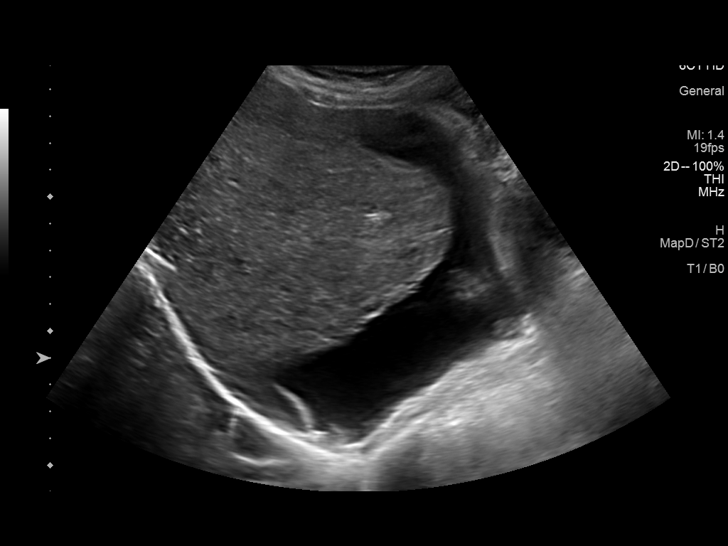
[im 25/47]
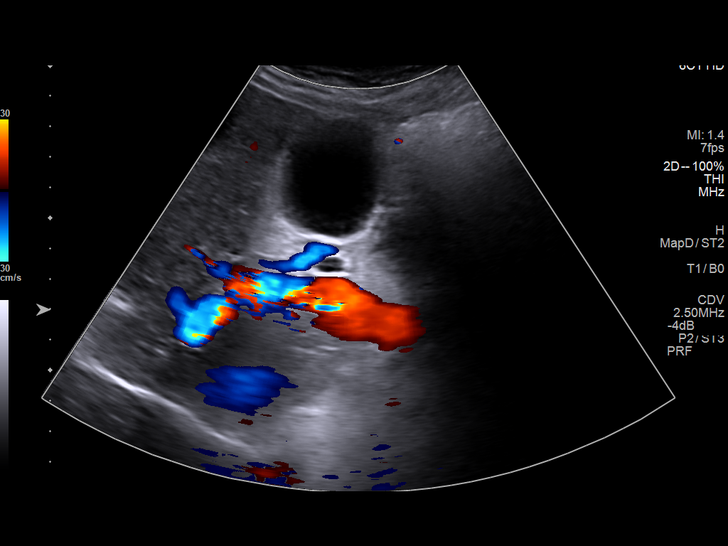
[im 29/47]
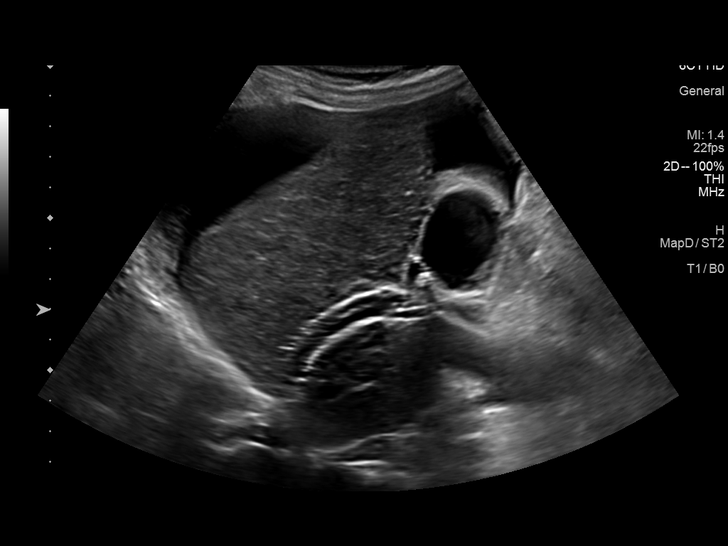
[im 31/47]
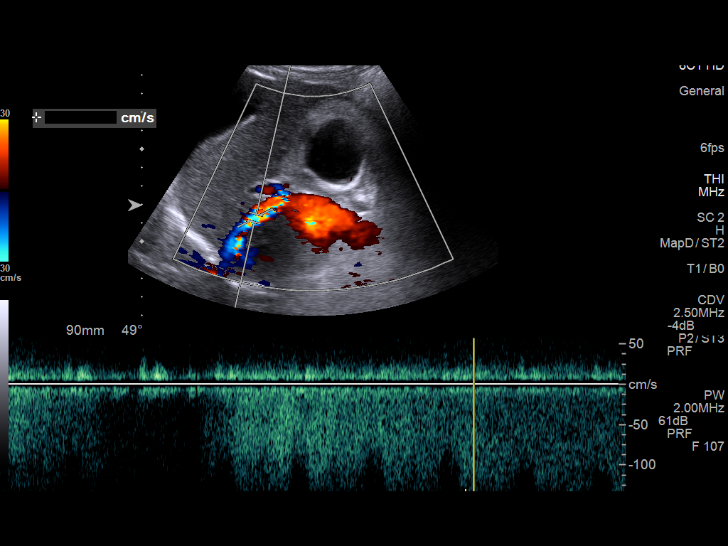
[im 35/47]
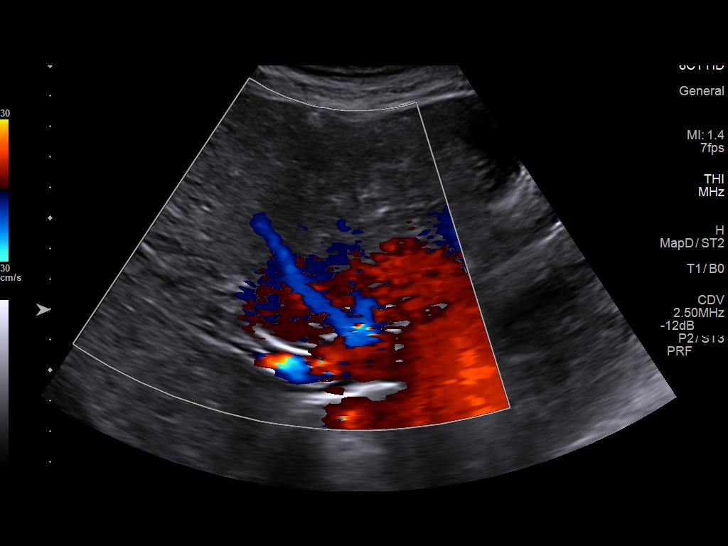
[im 39/47]
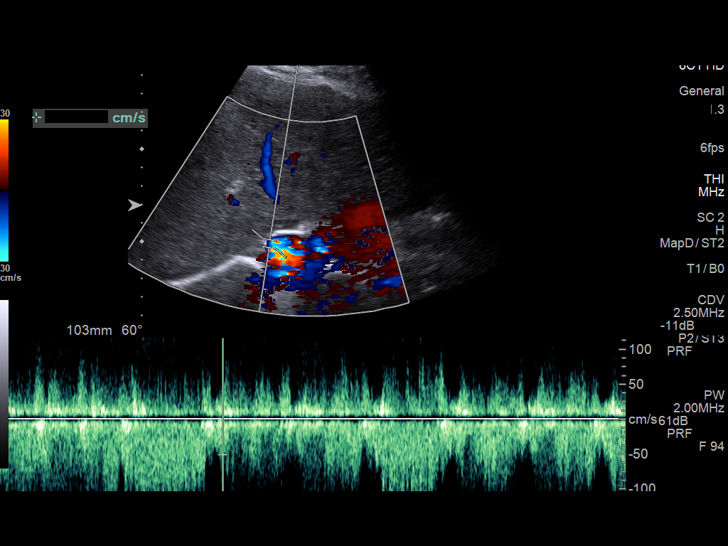
[im 43/47]
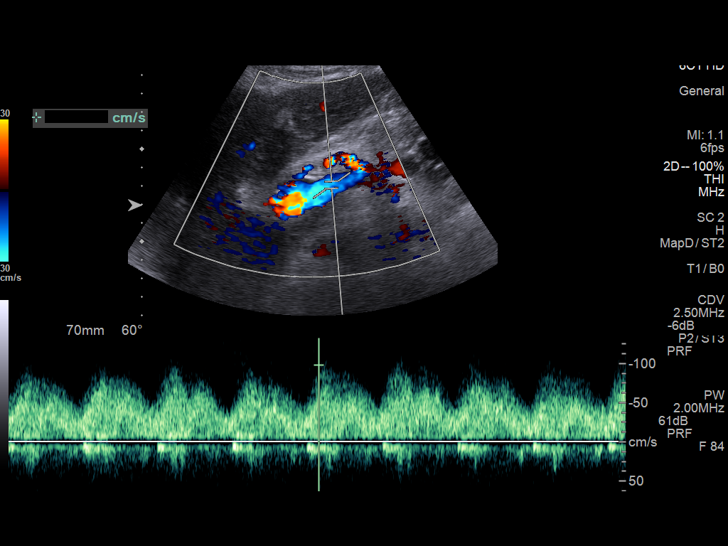
[im 47/47]
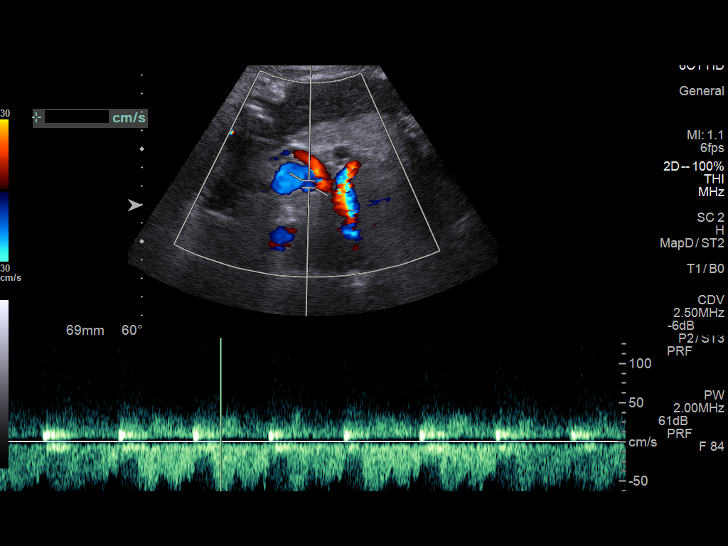

[14 of 25 positions shown; findings below may reference images not displayed]

FINDINGS: Portal Vein Velocities

Main:  76 cm/sec

Right:  109 cm/sec

Left:  75 cm/sec

TIPS Stent Velocities

Proximal:  202 cm/sec

Mid:  147

Distal:  175 cm/sec

IVC: Present and patent with normal respiratory phasicity.

Hepatic Vein Velocities

Right:  41 cm/sec

Mid:  51 cm/sec

Left:  50 cm/sec

Splenic Vein: 35

Superior Mesenteric Vein: 98

Hepatic Artery: 151 cm/second

Ascites: Present

Varices: Absent

Other: Hepatic cirrhosis. Cholelithiasis is also present. No biliary
ductal dilatation.
IMPRESSION: 1. Widely patent tips without evidence of dysfunction.
2. Small volume ascites.
3. Hepatic cirrhosis and cholelithiasis.
